# Patient Record
Sex: Female | Born: 1955 | Race: White | Hispanic: No | Marital: Married | Smoking: Current every day smoker
Health system: Southern US, Community
[De-identification: ages and names within clinical notes are randomized; demographics above are authoritative.]

## PROBLEM LIST (undated history)

## (undated) DIAGNOSIS — I2510 Atherosclerotic heart disease of native coronary artery without angina pectoris: Secondary | ICD-10-CM

## (undated) DIAGNOSIS — R569 Unspecified convulsions: Secondary | ICD-10-CM

## (undated) DIAGNOSIS — F329 Major depressive disorder, single episode, unspecified: Secondary | ICD-10-CM

## (undated) DIAGNOSIS — G2581 Restless legs syndrome: Secondary | ICD-10-CM

## (undated) DIAGNOSIS — J45909 Unspecified asthma, uncomplicated: Secondary | ICD-10-CM

## (undated) DIAGNOSIS — Z860100 Personal history of colon polyps, unspecified: Secondary | ICD-10-CM

## (undated) DIAGNOSIS — J189 Pneumonia, unspecified organism: Secondary | ICD-10-CM

## (undated) DIAGNOSIS — E119 Type 2 diabetes mellitus without complications: Secondary | ICD-10-CM

## (undated) DIAGNOSIS — G4733 Obstructive sleep apnea (adult) (pediatric): Secondary | ICD-10-CM

## (undated) DIAGNOSIS — I209 Angina pectoris, unspecified: Secondary | ICD-10-CM

## (undated) DIAGNOSIS — F32A Depression, unspecified: Secondary | ICD-10-CM

## (undated) DIAGNOSIS — E7800 Pure hypercholesterolemia, unspecified: Secondary | ICD-10-CM

## (undated) DIAGNOSIS — F909 Attention-deficit hyperactivity disorder, unspecified type: Secondary | ICD-10-CM

## (undated) DIAGNOSIS — F419 Anxiety disorder, unspecified: Secondary | ICD-10-CM

## (undated) DIAGNOSIS — K589 Irritable bowel syndrome without diarrhea: Secondary | ICD-10-CM

## (undated) DIAGNOSIS — I7090 Unspecified atherosclerosis: Secondary | ICD-10-CM

## (undated) DIAGNOSIS — K219 Gastro-esophageal reflux disease without esophagitis: Secondary | ICD-10-CM

## (undated) DIAGNOSIS — K8020 Calculus of gallbladder without cholecystitis without obstruction: Secondary | ICD-10-CM

## (undated) DIAGNOSIS — I10 Essential (primary) hypertension: Secondary | ICD-10-CM

## (undated) DIAGNOSIS — M129 Arthropathy, unspecified: Secondary | ICD-10-CM

## (undated) HISTORY — DX: Restless legs syndrome: G25.81

## (undated) HISTORY — DX: Anxiety disorder, unspecified: F41.9

## (undated) HISTORY — DX: Obstructive sleep apnea (adult) (pediatric): G47.33

## (undated) HISTORY — PX: ABDOMINAL HYSTERECTOMY: SHX81

## (undated) HISTORY — DX: Pure hypercholesterolemia, unspecified: E78.00

## (undated) HISTORY — DX: Unspecified asthma, uncomplicated: J45.909

## (undated) HISTORY — DX: Calculus of gallbladder without cholecystitis without obstruction: K80.20

## (undated) HISTORY — PX: CARPAL TUNNEL RELEASE: SHX101

## (undated) HISTORY — DX: Essential (primary) hypertension: I10

## (undated) HISTORY — DX: Pneumonia, unspecified organism: J18.9

## (undated) HISTORY — DX: Depression, unspecified: F32.A

## (undated) HISTORY — DX: Personal history of colonic polyps: Z86.010

## (undated) HISTORY — DX: Type 2 diabetes mellitus without complications: E11.9

## (undated) HISTORY — DX: Angina pectoris, unspecified: I20.9

## (undated) HISTORY — DX: Unspecified atherosclerosis: I70.90

## (undated) HISTORY — DX: Irritable bowel syndrome, unspecified: K58.9

## (undated) HISTORY — PX: TOTAL ABDOMINAL HYSTERECTOMY W/ BILATERAL SALPINGOOPHORECTOMY: SHX83

## (undated) HISTORY — DX: Arthropathy, unspecified: M12.9

## (undated) HISTORY — PX: TRIGGER FINGER RELEASE: SHX641

## (undated) HISTORY — DX: Attention-deficit hyperactivity disorder, unspecified type: F90.9

## (undated) HISTORY — DX: Personal history of colon polyps, unspecified: Z86.0100

## (undated) HISTORY — DX: Atherosclerotic heart disease of native coronary artery without angina pectoris: I25.10

## (undated) HISTORY — DX: Unspecified convulsions: R56.9

## (undated) HISTORY — DX: Gastro-esophageal reflux disease without esophagitis: K21.9

---
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
## 1978-06-13 HISTORY — PX: VESICOVAGINAL FISTULA CLOSURE W/ TAH: SUR271

## 1978-06-13 HISTORY — PX: APPENDECTOMY: SHX54

## 1995-06-14 HISTORY — PX: CHOLECYSTECTOMY: SHX55

## 1999-06-14 HISTORY — PX: CERVICAL FUSION: SHX112

## 2009-02-12 ENCOUNTER — Encounter: Admission: RE | Admit: 2009-02-12 | Discharge: 2009-02-12 | Payer: Self-pay | Admitting: Neurosurgery

## 2009-02-20 ENCOUNTER — Ambulatory Visit (HOSPITAL_COMMUNITY): Admission: RE | Admit: 2009-02-20 | Discharge: 2009-02-20 | Payer: Self-pay | Admitting: Neurosurgery

## 2009-04-03 ENCOUNTER — Ambulatory Visit (HOSPITAL_COMMUNITY): Admission: RE | Admit: 2009-04-03 | Discharge: 2009-04-03 | Payer: Self-pay | Admitting: Neurosurgery

## 2010-09-16 LAB — BASIC METABOLIC PANEL
CO2: 27 mEq/L (ref 19–32)
Calcium: 9.7 mg/dL (ref 8.4–10.5)
Creatinine, Ser: 0.89 mg/dL (ref 0.4–1.2)
GFR calc Af Amer: >60 mL/min (ref >60)
GFR calc non Af Amer: >60 mL/min (ref >60)
Glucose, Bld: 101 mg/dL — ABNORMAL HIGH (ref 70–99)

## 2010-09-16 LAB — CBC
MCHC: 34.8 g/dL (ref 30.0–36.0)
Platelets: 218 10*3/uL (ref 150–400)
RDW: 13.4 % (ref 11.5–15.5)

## 2010-09-17 LAB — CBC
Hemoglobin: 15 g/dL (ref 12.0–15.0)
RBC: 4.79 MIL/uL (ref 3.87–5.11)
RDW: 13.7 % (ref 11.5–15.5)
WBC: 10.6 10*3/uL — ABNORMAL HIGH (ref 4.0–10.5)

## 2010-09-17 LAB — BASIC METABOLIC PANEL
Calcium: 9.8 mg/dL (ref 8.4–10.5)
GFR calc Af Amer: >60 mL/min (ref >60)
GFR calc non Af Amer: >60 mL/min (ref >60)
Sodium: 137 mEq/L (ref 135–145)

## 2011-06-14 IMAGING — CR DG CHEST 2V
2 series · 2 of 2 positions shown · non-contrast
Comparison: None

CLINICAL DATA: Preadmission

CHEST - 2 VIEW

[view not recorded (1 of 2)]
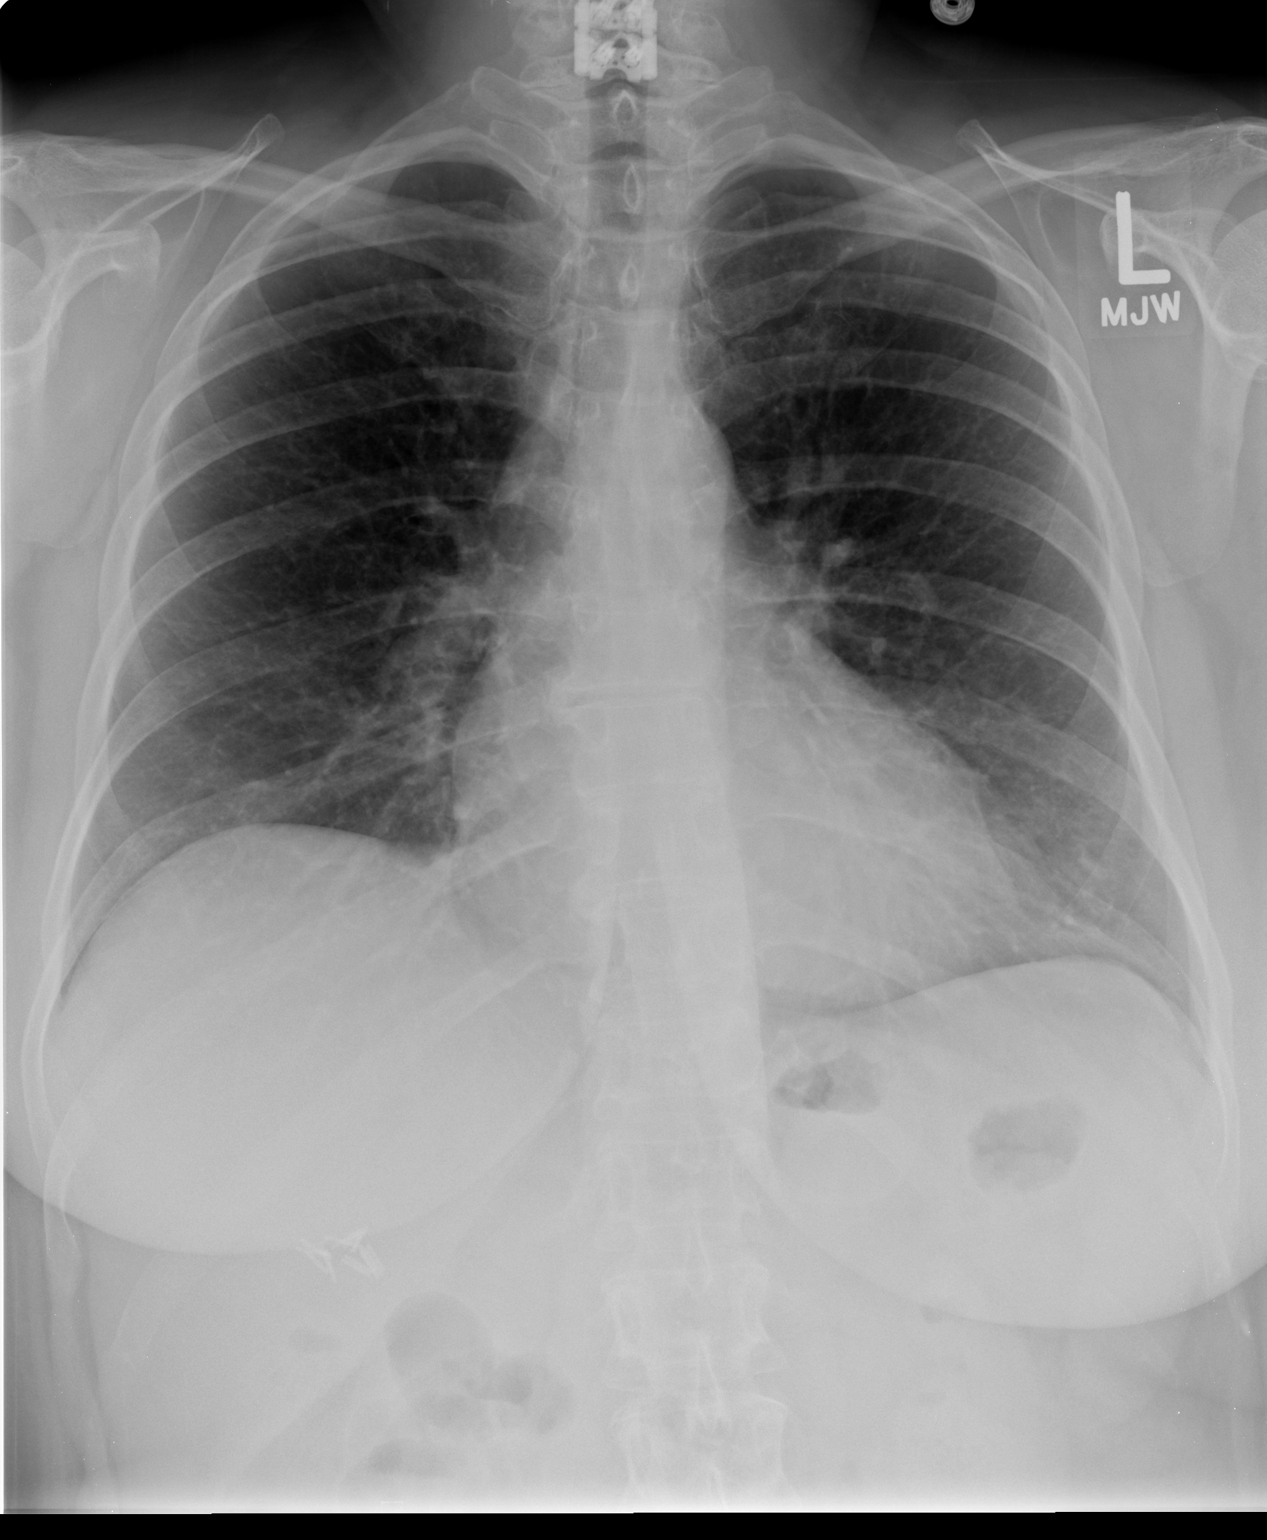

[view not recorded (2 of 2)]
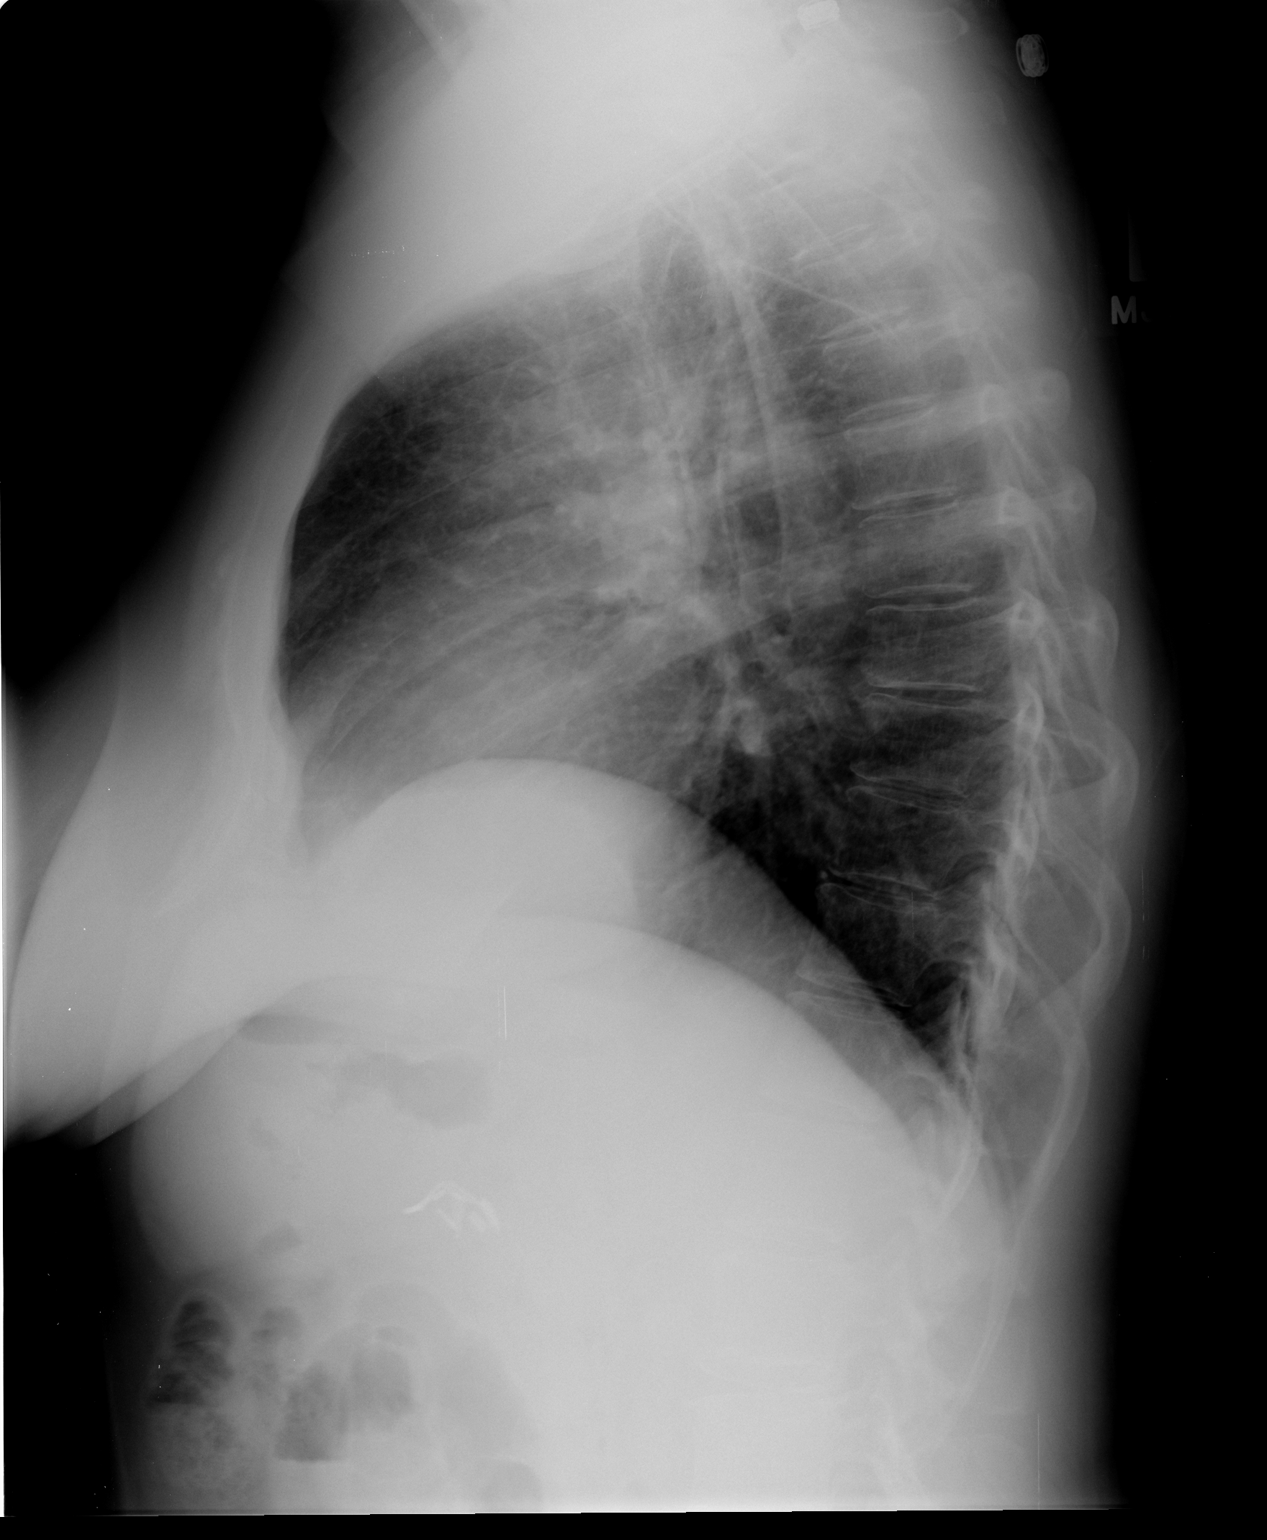

[2 of 2 positions shown; findings below may reference images not displayed]

FINDINGS: There is elevation of the right hemidiaphragm.
Cardiomediastinal silhouette is unremarkable.  No acute infiltrate
or edema.  Prior metallic fusion noted lower cervical spine.  Post
cholecystectomy surgical clips are noted.
IMPRESSION: No active disease.

## 2012-12-21 HISTORY — PX: ESOPHAGOGASTRODUODENOSCOPY: SHX1529

## 2014-01-17 HISTORY — PX: COLONOSCOPY: SHX5424

## 2014-04-09 ENCOUNTER — Ambulatory Visit (INDEPENDENT_AMBULATORY_CARE_PROVIDER_SITE_OTHER): Payer: Medicaid Other | Admitting: Internal Medicine

## 2014-04-09 ENCOUNTER — Encounter: Payer: Self-pay | Admitting: Internal Medicine

## 2014-04-09 VITALS — BP 102/74 | HR 97 | Ht 67.75 in | Wt 249.0 lb

## 2014-04-09 DIAGNOSIS — Z720 Tobacco use: Secondary | ICD-10-CM | POA: Diagnosis not present

## 2014-04-09 DIAGNOSIS — F17210 Nicotine dependence, cigarettes, uncomplicated: Secondary | ICD-10-CM | POA: Insufficient documentation

## 2014-04-09 DIAGNOSIS — R0600 Dyspnea, unspecified: Secondary | ICD-10-CM | POA: Insufficient documentation

## 2014-04-09 DIAGNOSIS — R058 Other specified cough: Secondary | ICD-10-CM | POA: Insufficient documentation

## 2014-04-09 DIAGNOSIS — R05 Cough: Secondary | ICD-10-CM

## 2014-04-09 DIAGNOSIS — J3489 Other specified disorders of nose and nasal sinuses: Secondary | ICD-10-CM

## 2014-04-09 HISTORY — DX: Nicotine dependence, cigarettes, uncomplicated: F17.210

## 2014-04-09 HISTORY — DX: Other specified disorders of nose and nasal sinuses: J34.89

## 2014-04-09 HISTORY — DX: Dyspnea, unspecified: R06.00

## 2014-04-09 HISTORY — DX: Other specified cough: R05.8

## 2014-04-09 MED ORDER — VALSARTAN 160 MG PO TABS: 160.0000 mg | ORAL_TABLET | Freq: Every day | ORAL | Status: DC

## 2014-04-09 NOTE — Assessment & Plan Note (Signed)
The most common causes of chronic cough in immunocompetent adults include the following: upper airway cough syndrome (UACS), previously referred to as postnasal drip syndrome (PNDS), which is caused by variety of rhinosinus conditions; (2) asthma; (3) GERD; (4) chronic bronchitis from cigarette smoking or other inhaled environmental irritants; (5) nonasthmatic eosinophilic bronchitis; and (6) bronchiectasis.   These conditions, singly or in combination, have accounted for up to 94% of the causes of chronic cough in prospective studies.   Other conditions have constituted no >6% of the causes in prospective studies These have included bronchogenic carcinoma, chronic interstitial pneumonia, sarcoidosis, left ventricular failure, ACEI-induced cough, and aspiration from a condition associated with pharyngeal dysfunction.    Chronic cough is often simultaneously caused by more than one condition. A single cause has been found from 38 to 82% of the time, multiple causes from 18 to 62%. Multiply caused cough has been the result of three diseases up to 42% of the time.       Based on hx and exam, this is most likely:  Classic Upper airway cough syndrome, so named because it's frequently impossible to sort out how much is  CR/sinusitis with freq throat clearing (which can be related to primary GERD)   vs  causing  secondary (" extra esophageal")  GERD from wide swings in gastric pressure that occur with throat clearing, often  promoting self use of mint and menthol lozenges that reduce the lower esophageal sphincter tone and exacerbate the problem further in a cyclical fashion.   These are the same pts (now being labeled as having "irritable larynx syndrome" by some cough centers) who not infrequently have a history of having failed to tolerate ace inhibitors (which is likely the case here) ,  dry powder inhalers(which is likely the case here) or biphosphonates or report having atypical reflux symptoms that don't  respond to standard doses of PPI (which is likely the case here) , and are easily confused as having aecopd or asthma flares by even experienced allergists/ pulmonologists.   The first step is to maximize acid suppression and eliminate cyclical coughing then regroup if the cough persists.  See instructions for specific recommendations which were reviewed directly with the patient who was given a copy with highlighter outlining the key components.

## 2014-04-09 NOTE — Assessment & Plan Note (Signed)
Symptoms are markedly disproportionate to objective findings and not clear this is a lung problem but pt does appear to have difficult airway management issues.   DDX of  difficult airways management all start with A and  include Adherence, Ace Inhibitors, Acid Reflux, Active Sinus Disease, Alpha 1 Antitripsin deficiency, Anxiety masquerading as Airways dz,  ABPA,  allergy(esp in young), Aspiration (esp in elderly), Adverse effects of DPI,  Active smokers, plus two Bs  = Bronchiectasis and Beta blocker use..and one C= CHF  ACEi at top of list > needs trial off  Active smoking also concern > see sep a/p  ? Acid (or non-acid) GERD > always difficult to exclude as up to 75% of pts in some series report no assoc GI/ Heartburn symptoms and ACEi and acid reflux are synergistic for destabilizing the upper airway in my experience > rec max (24h)  acid suppression and diet restrictions/ reviewed and instructions given in writing.

## 2014-04-09 NOTE — Patient Instructions (Addendum)
Stop lisinopril   Until better just use your duoneb for breathing problem, not your advair (ok to restart when your cough is gone)  Start diovan (valsartan) 160 mg one daily in its place   dexilant Take 30- 60 min before your first and last meals of the day   GERD (REFLUX)  is an extremely common cause of respiratory symptoms, many times with no significant heartburn at all.    It can be treated with medication, but also with lifestyle changes including avoidance of late meals, excessive alcohol, smoking cessation, and avoid fatty foods, chocolate, peppermint, colas, red wine, and acidic juices such as orange juice.  NO MINT OR MENTHOL PRODUCTS SO NO COUGH DROPS  USE SUGARLESS CANDY INSTEAD (jolley ranchers or Stover's)  NO OIL BASED VITAMINS - use powdered substitutes.    If not 100% back to your normal self, return in 4 weeks with all your medications in hand

## 2014-04-09 NOTE — Assessment & Plan Note (Signed)

## 2014-04-09 NOTE — Progress Notes (Signed)
Subjective:    Patient ID: Kara Olson, female    DOB: [birth year]  MRN: [id number]  HPI   89 yowf active smoker with h/o able to play on playground as child but never able to tolerated any kind of  aerobics but did all activities including push mower until summer 2013 at wt 89  dx asthma and sleep apnea by Dr Alcide Clever intially did well on advair and proair then gradually worse cough and sob since late summer of 2015 so referred by Dr Carolanne Grumbling to pulmonary clinic 04/09/2014    04/09/2014 1st [facility] office visit/ Melvyn Novas / on ACEi  Chief Complaint  Patient presents with  . Pulmonary Consult    Referred by Dr. Carolanne Grumbling. Pt c/o SOB for the past year, progressively getting worse. She states that she is SOB all the time- with or without any exertion.  She also c/o prod cough with "air bubbles"- sputum is clear.    since breathing got worse summer of 2015 more cough / congestion but no excess or purulent mucus and even duoneb not helping sense of sob/ throat congestion/ cough to point of choking at rest worst at hs and no longer able to use cpap due to cough. Also severe sense of HB despite dexilant, assoc with sense of "swollen all over"  = abd boating  but no objective swelling in feet/ankles advair makes it worse  No obvious other patterns in day to day or daytime variabilty or assoc chronic cough or cp or chest tightness, subjective wheeze overt sinus or hb symptoms. No unusual exp hx or h/o childhood pna/ asthma or knowledge of premature birth.  Sleeping ok without nocturnal  or early am exacerbation  of respiratory  c/o's or need for noct saba. Also denies any obvious fluctuation of symptoms with weather or environmental changes or other aggravating or alleviating factors except as outlined above   Current Medications, Allergies, Complete Past Medical History, Past Surgical History, Family History, and Social History were reviewed in [employer] record.          Review of Systems  Constitutional: Negative for fever, chills and unexpected weight change.  HENT: Negative for congestion, dental problem, ear pain, nosebleeds, postnasal drip, rhinorrhea, sinus pressure, sneezing, sore throat, trouble swallowing and voice change.   Eyes: Negative for visual disturbance.  Respiratory: Positive for cough and shortness of breath. Negative for choking.   Cardiovascular: Positive for leg swelling. Negative for chest pain.  Gastrointestinal: Negative for vomiting, abdominal pain and diarrhea.  Genitourinary: Negative for difficulty urinating.       Acid heartburn Indigestion  Musculoskeletal: Positive for arthralgias.  Skin: Negative for rash.  Neurological: Negative for tremors, syncope and headaches.  Hematological: Does not bruise/bleed easily.       Objective:   Physical Exam  Obese hoarse wf nad  Wt Readings from Last 3 Encounters:  04/09/14 249 lb (112.946 kg)     HEENT: nl dentition, turbinates, and orophanx. Nl external ear canals without cough reflex   NECK :  without JVD/Nodes/TM/ nl carotid upstrokes bilaterally   LUNGS: no acc muscle use, clear to A and P bilaterally without cough on insp or exp maneuvers   CV:  RRR  no s3 or murmur or increase in P2, no edema   ABD:  Obese/ soft and nontender with nl excursion in the supine position. No bruits or organomegaly, bowel sounds nl  MS:  warm without deformities, calf tenderness, cyanosis or clubbing  SKIN: warm and dry without lesions    NEURO:  alert, approp, no deficits          Assessment & Plan:

## 2014-05-07 ENCOUNTER — Ambulatory Visit: Payer: Medicaid Other | Admitting: Internal Medicine

## 2014-05-12 ENCOUNTER — Encounter: Payer: Self-pay | Admitting: Internal Medicine

## 2016-01-07 DIAGNOSIS — I2510 Atherosclerotic heart disease of native coronary artery without angina pectoris: Secondary | ICD-10-CM | POA: Insufficient documentation

## 2016-01-07 DIAGNOSIS — I10 Essential (primary) hypertension: Secondary | ICD-10-CM

## 2016-01-07 DIAGNOSIS — E785 Hyperlipidemia, unspecified: Secondary | ICD-10-CM

## 2016-01-07 DIAGNOSIS — G4730 Sleep apnea, unspecified: Secondary | ICD-10-CM | POA: Insufficient documentation

## 2016-01-07 HISTORY — DX: Essential (primary) hypertension: I10

## 2016-01-07 HISTORY — DX: Atherosclerotic heart disease of native coronary artery without angina pectoris: I25.10

## 2016-01-07 HISTORY — DX: Sleep apnea, unspecified: G47.30

## 2016-01-07 HISTORY — DX: Hyperlipidemia, unspecified: E78.5

## 2017-03-10 ENCOUNTER — Telehealth: Payer: Self-pay | Admitting: *Deleted

## 2017-03-10 NOTE — Telephone Encounter (Signed)
Received request for Medical records from [facility], forwarded to [place] for email/scan/SLS 09/28

## 2018-07-25 ENCOUNTER — Encounter: Payer: Self-pay | Admitting: Sports Medicine

## 2018-07-25 ENCOUNTER — Ambulatory Visit: Payer: Medicaid Other | Admitting: Sports Medicine

## 2018-07-25 VITALS — BP 139/84 | HR 53 | Resp 16 | Ht 67.0 in | Wt 230.0 lb

## 2018-07-25 DIAGNOSIS — B351 Tinea unguium: Secondary | ICD-10-CM | POA: Diagnosis not present

## 2018-07-25 DIAGNOSIS — M79674 Pain in right toe(s): Secondary | ICD-10-CM

## 2018-07-25 DIAGNOSIS — E119 Type 2 diabetes mellitus without complications: Secondary | ICD-10-CM

## 2018-07-25 DIAGNOSIS — M79675 Pain in left toe(s): Secondary | ICD-10-CM

## 2018-07-25 NOTE — Progress Notes (Signed)
Subjective: Kara Olson is a 63 y.o. female patient with history of diabetes who presents to office today complaining of long,mildly painful nails with corners that ingrow  while ambulating in shoes; unable to trim.  Reports that she had nail procedure many years ago.  Patient states that the glucose reading this morning was 130mg /dl. Patient denies any new changes in medication or new problems. Patient denies any new cramping, numbness, burning or tingling in the legs.  Patient last visit to primary care Dr. Delena Bali was 1 month ago and last A1c 6.5  Current smoker.  Review of Systems  Musculoskeletal:       Nail pain  All other systems reviewed and are negative. '  Patient Active Problem List   Diagnosis Date Noted  . Essential hypertension 01/07/2016  . Hyperlipidemia 01/07/2016  . Mild CAD 01/07/2016  . Sleep apnea 01/07/2016  . Upper airway cough syndrome 04/09/2014  . Dyspnea 04/09/2014  . Cigarette smoker 04/09/2014   Current Outpatient Medications on File Prior to Visit  Medication Sig Dispense Refill  . ALPRAZolam (XANAX) 0.5 MG tablet Take 0.5 mg by mouth 3 (three) times daily as needed for anxiety.    Marland Kitchen atorvastatin (LIPITOR) 40 MG tablet Take 40 mg by mouth daily.    . Cholecalciferol (VITAMIN D-3) 5000 UNITS TABS Take 1 tablet by mouth daily.    . dapagliflozin propanediol (FARXIGA) 5 MG TABS tablet Take by mouth.    . Dexlansoprazole (DEXILANT) 30 MG capsule Take 30 mg by mouth 2 (two) times daily.    Marland Kitchen ezetimibe (ZETIA) 10 MG tablet Take 10 mg by mouth daily.    . furosemide (LASIX) 40 MG tablet Take 80 mg by mouth daily.    Marland Kitchen ipratropium-albuterol (DUONEB) 0.5-2.5 (3) MG/3ML SOLN Take 3 mLs by nebulization every 4 (four) hours as needed.    Marland Kitchen losartan (COZAAR) 100 MG tablet Take 100 mg by mouth daily.    Marland Kitchen lubiprostone (AMITIZA) 24 MCG capsule Take 24 mcg by mouth 2 (two) times daily with a meal.    . metFORMIN (GLUCOPHAGE) 500 MG tablet Take by mouth.    .  metoprolol tartrate (LOPRESSOR) 50 MG tablet TAKE 1 TABLET BY MOUTH TWICE A DAY    . nitroGLYCERIN (NITROSTAT) 0.4 MG SL tablet Place 0.4 mg under the tongue every 5 (five) minutes as needed for chest pain.    . Vitamin D, Ergocalciferol, (DRISDOL) 1.25 MG (50000 UT) CAPS capsule Take by mouth.     No current facility-administered medications on file prior to visit.    Allergies  Allergen Reactions  . Sulfamethoxazole Hives  . Tape   . Latex Rash    No results found for this or any previous visit (from the past 2160 hour(s)).  Objective: General: Patient is awake, alert, and oriented x 3 and in no acute distress.  Integument: Skin is warm, dry and supple bilateral. Nails are tender, long, thickened and  dystrophic with subungual debris, consistent with onychomycosis, 1-5 bilateral.  Mild incurvation and thickness bilateral hallux and right second toe medial side that is not acute.  No signs of infection. No open lesions or preulcerative lesions present bilateral. Remaining integument unremarkable.  Vasculature:  Dorsalis Pedis pulse 1/4 bilateral. Posterior Tibial pulse  1/4 bilateral.  Capillary fill time <3 sec 1-5 bilateral. Positive hair growth to the level of the digits. Temperature gradient within normal limits. No varicosities present bilateral. No edema present bilateral.   Neurology: The patient has intact sensation  measured with a 5.07/10g Semmes Weinstein Monofilament at all pedal sites bilateral . Vibratory sensation diminished bilateral with tuning fork. No Babinski sign present bilateral.   Musculoskeletal: No symptomatic pedal deformities noted bilateral. Muscular strength 5/5 in all lower extremity muscular groups bilateral without pain on range of motion . No tenderness with calf compression bilateral.  Assessment and Plan: Problem List Items Addressed This Visit    None    Visit Diagnoses    Pain due to onychomycosis of toenails of both feet    -  Primary    Diabetes mellitus without complication (HCC)       Relevant Medications   dapagliflozin propanediol (FARXIGA) 5 MG TABS tablet   metFORMIN (GLUCOPHAGE) 500 MG tablet   losartan (COZAAR) 100 MG tablet      -Examined patient. -Discussed and educated patient on diabetic foot care, especially with  regards to the vascular, neurological and musculoskeletal systems.  -Stressed the importance of good glycemic control and the detriment of not  controlling glucose levels in relation to the foot. -Mechanically debrided all nails 1-5 bilateral using sterile nail nipper and filed with dremel without incident  -Patient to return  in 3 months for at risk foot care -Patient advised to call the office if any problems or questions arise in the meantime.  Landis Martins, DPM

## 2018-07-25 NOTE — Patient Instructions (Signed)
Diabetes Mellitus and Foot Care  Foot care is an important part of your health, especially when you have diabetes. Diabetes may cause you to have problems because of poor blood flow (circulation) to your feet and legs, which can cause your skin to:   Become thinner and drier.   Break more easily.   Heal more slowly.   Peel and crack.  You may also have nerve damage (neuropathy) in your legs and feet, causing decreased feeling in them. This means that you may not notice minor injuries to your feet that could lead to more serious problems. Noticing and addressing any potential problems early is the best way to prevent future foot problems.  How to care for your feet  Foot hygiene   Wash your feet daily with warm water and mild soap. Do not use hot water. Then, pat your feet and the areas between your toes until they are completely dry. Do not soak your feet as this can dry your skin.   Trim your toenails straight across. Do not dig under them or around the cuticle. File the edges of your nails with an emery board or nail file.   Apply a moisturizing lotion or petroleum jelly to the skin on your feet and to dry, brittle toenails. Use lotion that does not contain alcohol and is unscented. Do not apply lotion between your toes.  Shoes and socks   Wear clean socks or stockings every day. Make sure they are not too tight. Do not wear knee-high stockings since they may decrease blood flow to your legs.   Wear shoes that fit properly and have enough cushioning. Always look in your shoes before you put them on to be sure there are no objects inside.   To break in new shoes, wear them for just a few hours a day. This prevents injuries on your feet.  Wounds, scrapes, corns, and calluses   Check your feet daily for blisters, cuts, bruises, sores, and redness. If you cannot see the bottom of your feet, use a mirror or ask someone for help.   Do not cut corns or calluses or try to remove them with medicine.   If you  find a minor scrape, cut, or break in the skin on your feet, keep it and the skin around it clean and dry. You may clean these areas with mild soap and water. Do not clean the area with peroxide, alcohol, or iodine.   If you have a wound, scrape, corn, or callus on your foot, look at it several times a day to make sure it is healing and not infected. Check for:  ? Redness, swelling, or pain.  ? Fluid or blood.  ? Warmth.  ? Pus or a bad smell.  General instructions   Do not cross your legs. This may decrease blood flow to your feet.   Do not use heating pads or hot water bottles on your feet. They may burn your skin. If you have lost feeling in your feet or legs, you may not know this is happening until it is too late.   Protect your feet from hot and cold by wearing shoes, such as at the beach or on hot pavement.   Schedule a complete foot exam at least once a year (annually) or more often if you have foot problems. If you have foot problems, report any cuts, sores, or bruises to your health care provider immediately.  Contact a health care provider if:     You have a medical condition that increases your risk of infection and you have any cuts, sores, or bruises on your feet.   You have an injury that is not healing.   You have redness on your legs or feet.   You feel burning or tingling in your legs or feet.   You have pain or cramps in your legs and feet.   Your legs or feet are numb.   Your feet always feel cold.   You have pain around a toenail.  Get help right away if:   You have a wound, scrape, corn, or callus on your foot and:  ? You have pain, swelling, or redness that gets worse.  ? You have fluid or blood coming from the wound, scrape, corn, or callus.  ? Your wound, scrape, corn, or callus feels warm to the touch.  ? You have pus or a bad smell coming from the wound, scrape, corn, or callus.  ? You have a fever.  ? You have a red line going up your leg.  Summary   Check your feet every day  for cuts, sores, red spots, swelling, and blisters.   Moisturize feet and legs daily.   Wear shoes that fit properly and have enough cushioning.   If you have foot problems, report any cuts, sores, or bruises to your health care provider immediately.   Schedule a complete foot exam at least once a year (annually) or more often if you have foot problems.  This information is not intended to replace advice given to you by your health care provider. Make sure you discuss any questions you have with your health care provider.  Document Released: 05/27/2000 Document Revised: 07/12/2017 Document Reviewed: 07/01/2016  Elsevier Interactive Patient Education  2019 [employer]

## 2018-10-31 ENCOUNTER — Ambulatory Visit: Payer: Medicaid Other | Admitting: Sports Medicine

## 2018-11-08 ENCOUNTER — Ambulatory Visit: Payer: Medicaid Other | Admitting: Sports Medicine

## 2018-11-09 ENCOUNTER — Other Ambulatory Visit: Payer: Self-pay

## 2018-11-09 ENCOUNTER — Encounter: Payer: Self-pay | Admitting: Sports Medicine

## 2018-11-09 ENCOUNTER — Ambulatory Visit: Payer: Medicaid Other | Admitting: Sports Medicine

## 2018-11-09 VITALS — Temp 96.6°F | Resp 16

## 2018-11-09 DIAGNOSIS — L97511 Non-pressure chronic ulcer of other part of right foot limited to breakdown of skin: Secondary | ICD-10-CM

## 2018-11-09 DIAGNOSIS — E119 Type 2 diabetes mellitus without complications: Secondary | ICD-10-CM

## 2018-11-09 MED ORDER — MUPIROCIN 2 % EX OINT: TOPICAL_OINTMENT | CUTANEOUS | 0 refills | Status: DC

## 2018-11-09 NOTE — Progress Notes (Signed)
Subjective: Vonnie A Ruggerio is a 63 y.o. female patient seen in office for evaluation of ulceration of the right great toe reports 17 days ago she bumped the right toe on the sidewalk in the skin was scraped off she had it sutured back on by the emergency room and reports that it is still tender to touch especially with shoes or pressure roughly about 3 out of 10 with tingling sensation reports that she had the stitches removed 2 days ago with bloody drainage and a mild amount of redness reports that she has been using antibiotic cream and a Band-Aid over the previous open flap skin states that her primary care doctor advised her to come in follow-up with me for additional recommendations and care.  Patient reports that she has completed a course of antibiotics after having a fall injury and also breaking her arm.  Patient denies nausea vomiting fever chills or any other constitutional symptoms at this time.  Patient Active Problem List   Diagnosis Date Noted  . Essential hypertension 01/07/2016  . Hyperlipidemia 01/07/2016  . Mild CAD 01/07/2016  . Sleep apnea 01/07/2016  . Upper airway cough syndrome 04/09/2014  . Dyspnea 04/09/2014  . Cigarette smoker 04/09/2014   Current Outpatient Medications on File Prior to Visit  Medication Sig Dispense Refill  . acetaminophen-codeine (TYLENOL #3) 300-30 MG tablet TAKE 1 TABLET EVERY 4 6 HOURS AS NEEDED EACH MORNING    . ALPRAZolam (XANAX) 0.5 MG tablet Take 0.5 mg by mouth 3 (three) times daily as needed for anxiety.    Marland Kitchen atorvastatin (LIPITOR) 40 MG tablet Take 40 mg by mouth daily.    . Cholecalciferol (VITAMIN D-3) 5000 UNITS TABS Take 1 tablet by mouth daily.    . dapagliflozin propanediol (FARXIGA) 5 MG TABS tablet Take by mouth.    . Dexlansoprazole (DEXILANT) 30 MG capsule Take 30 mg by mouth 2 (two) times daily.    Marland Kitchen erythromycin ophthalmic ointment APPLY TO LIDS AT BEDTIME    . ezetimibe (ZETIA) 10 MG tablet Take 10 mg by mouth daily.    .  furosemide (LASIX) 40 MG tablet Take 80 mg by mouth daily.    Marland Kitchen ipratropium-albuterol (DUONEB) 0.5-2.5 (3) MG/3ML SOLN Take 3 mLs by nebulization every 4 (four) hours as needed.    Marland Kitchen losartan (COZAAR) 100 MG tablet Take 100 mg by mouth daily.    Marland Kitchen lubiprostone (AMITIZA) 24 MCG capsule Take 24 mcg by mouth 2 (two) times daily with a meal.    . metFORMIN (GLUCOPHAGE) 500 MG tablet Take by mouth.    . metoprolol tartrate (LOPRESSOR) 50 MG tablet TAKE 1 TABLET BY MOUTH TWICE A DAY    . nitroGLYCERIN (NITROSTAT) 0.4 MG SL tablet Place 0.4 mg under the tongue every 5 (five) minutes as needed for chest pain.    Marland Kitchen PARoxetine (PAXIL) 20 MG tablet Take 20 mg by mouth daily.    . Vitamin D, Ergocalciferol, (DRISDOL) 1.25 MG (50000 UT) CAPS capsule Take by mouth.     No current facility-administered medications on file prior to visit.    Allergies  Allergen Reactions  . Sulfamethoxazole Hives  . Tape   . Latex Rash    No results found for this or any previous visit (from the past 2160 hour(s)).  Objective: There were no vitals filed for this visit.  General: Patient is awake, alert, oriented x 3 and in no acute distress.  Dermatology: Skin is warm and dry bilateral with a partial thickness  ulceration present  Right hallux distal tuft.  Ulceration measures 0.3 cm x 0.4 cm x 0.1 cm. There is a  Keratotic border with a fibro-granular base and superficial skin layer/flap appears to be peeling up and not adhered.  The ulceration does not probe to bone. There is no malodor, no active drainage, no erythema, no edema. No acute signs of infection.   Vascular: Dorsalis Pedis pulse = 2/4 Bilateral,  Posterior Tibial pulse = 1/4 Bilateral,  Capillary Fill Time < 5 seconds  Neurologic: Protective sensation intact vibratory diminished bilateral.  Musculosketal: There is no pain to palpation only with excessive walking and standing at the right great toe.  Xrays at [facility] are negative  No  results for input(s): GRAMSTAIN, LABORGA in the last 8760 hours.  Assessment and Plan:  Problem List Items Addressed This Visit    None    Visit Diagnoses    Skin ulcer of toe of right foot, limited to breakdown of skin ([place])    -  Primary   Diabetes mellitus without complication ([place])           -Examined patient and discussed the progression of the wound and treatment alternatives. -Xrays reviewed from [facility] dedbrided ulceration at right great toe to healthy bleeding borders removing nonviable tissue using a sterile chisel blade. Wound measures post debridement. Wound was debrided to the level of the dermis with viable wound base exposed to promote healing. Hemostasis was achieved with manuel pressure. Patient tolerated procedure well without any discomfort or anesthesia necessary for this wound debridement.  -Applied Betadine and dry sterile dressing and instructed patient to continue with daily dressings at home consisting of Bactroban as prescribed and bandaid/dry sterile dressing. - Advised patient to go to the ER or return to office if the wound worsens or if constitutional symptoms are present. -Patient to return to office in 2 weeks for wound check and for routine diabetic nail care.  Landis Martins, DPM

## 2018-11-22 ENCOUNTER — Ambulatory Visit: Payer: Medicaid Other | Admitting: Sports Medicine

## 2018-11-22 ENCOUNTER — Other Ambulatory Visit: Payer: Self-pay

## 2018-11-22 ENCOUNTER — Encounter: Payer: Self-pay | Admitting: Sports Medicine

## 2018-11-22 VITALS — Temp 97.6°F | Resp 16

## 2018-11-22 DIAGNOSIS — B351 Tinea unguium: Secondary | ICD-10-CM | POA: Diagnosis not present

## 2018-11-22 DIAGNOSIS — M79675 Pain in left toe(s): Secondary | ICD-10-CM

## 2018-11-22 DIAGNOSIS — E119 Type 2 diabetes mellitus without complications: Secondary | ICD-10-CM

## 2018-11-22 DIAGNOSIS — M79674 Pain in right toe(s): Secondary | ICD-10-CM | POA: Diagnosis not present

## 2018-11-22 DIAGNOSIS — L97511 Non-pressure chronic ulcer of other part of right foot limited to breakdown of skin: Secondary | ICD-10-CM

## 2018-11-22 NOTE — Progress Notes (Signed)
Subjective: Kara Olson is a 63 y.o. female patient with history of diabetes who presents to office today complaining of long,mildly painful nails  while ambulating in shoes; unable to trim. Patient is also here for a toe check at the right great toe had a wound that happened after a stub injury.  Patient reports that the area is healing up fine with no active drainage redness warmth or pain to the right great toe.  Patient states that the glucose reading this morning was 120 mg/dl. Patient denies any new changes in medication or new problems. Patient denies any new cramping, numbness, burning or tingling in the legs.  Last A1c 6.1 last visit to primary care doctor was 3 months ago  Patient Active Problem List   Diagnosis Date Noted  . Essential hypertension 01/07/2016  . Hyperlipidemia 01/07/2016  . Mild CAD 01/07/2016  . Sleep apnea 01/07/2016  . Upper airway cough syndrome 04/09/2014  . Dyspnea 04/09/2014  . Cigarette smoker 04/09/2014   Current Outpatient Medications on File Prior to Visit  Medication Sig Dispense Refill  . acetaminophen-codeine (TYLENOL #3) 300-30 MG tablet TAKE 1 TABLET EVERY 4 6 HOURS AS NEEDED EACH MORNING    . ALPRAZolam (XANAX) 0.5 MG tablet Take 0.5 mg by mouth 3 (three) times daily as needed for anxiety.    Marland Kitchen atorvastatin (LIPITOR) 40 MG tablet Take 40 mg by mouth daily.    . Cholecalciferol (VITAMIN D-3) 5000 UNITS TABS Take 1 tablet by mouth daily.    . dapagliflozin propanediol (FARXIGA) 5 MG TABS tablet Take by mouth.    . Dexlansoprazole (DEXILANT) 30 MG capsule Take 30 mg by mouth 2 (two) times daily.    Marland Kitchen erythromycin ophthalmic ointment APPLY TO LIDS AT BEDTIME    . ezetimibe (ZETIA) 10 MG tablet Take 10 mg by mouth daily.    . furosemide (LASIX) 40 MG tablet Take 80 mg by mouth daily.    Marland Kitchen ipratropium-albuterol (DUONEB) 0.5-2.5 (3) MG/3ML SOLN Take 3 mLs by nebulization every 4 (four) hours as needed.    Marland Kitchen losartan (COZAAR) 100 MG tablet Take 100  mg by mouth daily.    Marland Kitchen lubiprostone (AMITIZA) 24 MCG capsule Take 24 mcg by mouth 2 (two) times daily with a meal.    . metFORMIN (GLUCOPHAGE) 500 MG tablet Take by mouth.    . metoprolol tartrate (LOPRESSOR) 50 MG tablet TAKE 1 TABLET BY MOUTH TWICE A DAY    . mupirocin ointment (BACTROBAN) 2 % Apply once daily to toe 22 g 0  . nitroGLYCERIN (NITROSTAT) 0.4 MG SL tablet Place 0.4 mg under the tongue every 5 (five) minutes as needed for chest pain.    Marland Kitchen PARoxetine (PAXIL) 20 MG tablet Take 20 mg by mouth daily.    . Vitamin D, Ergocalciferol, (DRISDOL) 1.25 MG (50000 UT) CAPS capsule Take by mouth.     No current facility-administered medications on file prior to visit.    Allergies  Allergen Reactions  . Sulfamethoxazole Hives  . Tape   . Latex Rash    No results found for this or any previous visit (from the past 2160 hour(s)).  Objective: General: Patient is awake, alert, and oriented x 3 and in no acute distress.  Integument: Skin is warm, dry and supple bilateral.  Right great toe ulceration is well-healed.  Nails are tender, long, thickened and  dystrophic with subungual debris, consistent with onychomycosis, 1-5 bilateral. No signs of infection. No open lesions or preulcerative lesions present  bilateral. Remaining integument unremarkable.  Vasculature:  Dorsalis Pedis pulse 2/4 bilateral. Posterior Tibial pulse 1/4 bilateral.  Capillary fill time <3 sec 1-5 bilateral. Positive hair growth to the level of the digits. Temperature gradient within normal limits. No varicosities present bilateral. No edema present bilateral.   Neurology: The patient has intact sensation measured with a 5.07/10g Semmes Weinstein Monofilament at all pedal sites bilateral . Vibratory sensation diminished bilateral with tuning fork. No Babinski sign present bilateral.   Musculoskeletal: No symptomatic pedal deformities noted bilateral. Muscular strength 5/5 in all lower extremity muscular groups  bilateral without pain on range of motion . No tenderness with calf compression bilateral.  Assessment and Plan: Problem List Items Addressed This Visit    None    Visit Diagnoses    Pain due to onychomycosis of toenails of both feet    -  Primary   Diabetes mellitus without complication (HCC)       Skin ulcer of toe of right foot, limited to breakdown of skin ([place])       Healed      -Examined patient. -Discussed and educated patient on diabetic foot care, especially with  regards to the vascular, neurological and musculoskeletal systems.  -Stressed the importance of good glycemic control and the detriment of not  controlling glucose levels in relation to the foot. -Mechanically debrided all nails 1-5 bilateral using sterile nail nipper and filed with dremel without incident  -Advised patient to continue with monitoring the right great toe it is healed well and to avoid things that may irritate or cause re-ulceration -Answered all patient questions -Patient to return  in 3 months for at risk foot care -Patient advised to call the office if any problems or questions arise in the meantime.  Landis Martins, DPM

## 2018-12-17 DIAGNOSIS — E785 Hyperlipidemia, unspecified: Secondary | ICD-10-CM

## 2018-12-17 DIAGNOSIS — G8929 Other chronic pain: Secondary | ICD-10-CM

## 2018-12-17 DIAGNOSIS — I2510 Atherosclerotic heart disease of native coronary artery without angina pectoris: Secondary | ICD-10-CM | POA: Diagnosis not present

## 2018-12-17 DIAGNOSIS — K219 Gastro-esophageal reflux disease without esophagitis: Secondary | ICD-10-CM

## 2018-12-17 DIAGNOSIS — E119 Type 2 diabetes mellitus without complications: Secondary | ICD-10-CM

## 2018-12-17 DIAGNOSIS — R0789 Other chest pain: Secondary | ICD-10-CM | POA: Diagnosis not present

## 2018-12-17 DIAGNOSIS — M545 Low back pain: Secondary | ICD-10-CM | POA: Diagnosis not present

## 2018-12-18 DIAGNOSIS — I2510 Atherosclerotic heart disease of native coronary artery without angina pectoris: Secondary | ICD-10-CM | POA: Diagnosis not present

## 2018-12-18 DIAGNOSIS — E785 Hyperlipidemia, unspecified: Secondary | ICD-10-CM | POA: Diagnosis not present

## 2018-12-18 DIAGNOSIS — R0789 Other chest pain: Secondary | ICD-10-CM | POA: Diagnosis not present

## 2018-12-18 DIAGNOSIS — M545 Low back pain: Secondary | ICD-10-CM | POA: Diagnosis not present

## 2018-12-18 DIAGNOSIS — R079 Chest pain, unspecified: Secondary | ICD-10-CM

## 2019-02-21 ENCOUNTER — Ambulatory Visit: Payer: Medicaid Other | Admitting: Sports Medicine

## 2019-03-07 ENCOUNTER — Other Ambulatory Visit: Payer: Self-pay

## 2019-03-07 ENCOUNTER — Encounter: Payer: Self-pay | Admitting: Sports Medicine

## 2019-03-07 ENCOUNTER — Ambulatory Visit (INDEPENDENT_AMBULATORY_CARE_PROVIDER_SITE_OTHER): Payer: Medicaid Other | Admitting: Sports Medicine

## 2019-03-07 DIAGNOSIS — M79674 Pain in right toe(s): Secondary | ICD-10-CM | POA: Diagnosis not present

## 2019-03-07 DIAGNOSIS — M79675 Pain in left toe(s): Secondary | ICD-10-CM | POA: Diagnosis not present

## 2019-03-07 DIAGNOSIS — B351 Tinea unguium: Secondary | ICD-10-CM | POA: Diagnosis not present

## 2019-03-07 DIAGNOSIS — E119 Type 2 diabetes mellitus without complications: Secondary | ICD-10-CM

## 2019-03-07 NOTE — Progress Notes (Signed)
Subjective: Kara Olson is a 63 y.o. female patient with history of diabetes who presents to office today complaining of long,mildly painful nails  while ambulating in shoes; unable to trim.  Patient reports that her right great toe is doing well with no recurrence of problems or issues.  Patient states that the glucose reading this morning was 160 mg/dl. Patient denies any new changes in medication or new problems.  Last A1c 6.2 last visit to primary care doctor was last month and will see again in December  Patient Active Problem List   Diagnosis Date Noted  . Essential hypertension 01/07/2016  . Hyperlipidemia 01/07/2016  . Mild CAD 01/07/2016  . Sleep apnea 01/07/2016  . Upper airway cough syndrome 04/09/2014  . Dyspnea 04/09/2014  . Cigarette smoker 04/09/2014   Current Outpatient Medications on File Prior to Visit  Medication Sig Dispense Refill  . acetaminophen-codeine (TYLENOL #3) 300-30 MG tablet TAKE 1 TABLET EVERY 4 6 HOURS AS NEEDED EACH MORNING    . ALPRAZolam (XANAX) 0.5 MG tablet Take 0.5 mg by mouth 3 (three) times daily as needed for anxiety.    Marland Kitchen atorvastatin (LIPITOR) 40 MG tablet Take 40 mg by mouth daily.    . Cholecalciferol (VITAMIN D-3) 5000 UNITS TABS Take 1 tablet by mouth daily.    . dapagliflozin propanediol (FARXIGA) 5 MG TABS tablet Take by mouth.    . Dexlansoprazole (DEXILANT) 30 MG capsule Take 30 mg by mouth 2 (two) times daily.    Marland Kitchen erythromycin ophthalmic ointment APPLY TO LIDS AT BEDTIME    . ezetimibe (ZETIA) 10 MG tablet Take 10 mg by mouth daily.    . furosemide (LASIX) 40 MG tablet Take 80 mg by mouth daily.    Marland Kitchen ipratropium-albuterol (DUONEB) 0.5-2.5 (3) MG/3ML SOLN Take 3 mLs by nebulization every 4 (four) hours as needed.    Marland Kitchen losartan (COZAAR) 100 MG tablet Take 100 mg by mouth daily.    Marland Kitchen lubiprostone (AMITIZA) 24 MCG capsule Take 24 mcg by mouth 2 (two) times daily with a meal.    . metFORMIN (GLUCOPHAGE) 500 MG tablet Take by mouth.     . metoprolol tartrate (LOPRESSOR) 50 MG tablet TAKE 1 TABLET BY MOUTH TWICE A DAY    . mupirocin ointment (BACTROBAN) 2 % Apply once daily to toe 22 g 0  . nitroGLYCERIN (NITROSTAT) 0.4 MG SL tablet Place 0.4 mg under the tongue every 5 (five) minutes as needed for chest pain.    Marland Kitchen PARoxetine (PAXIL) 20 MG tablet Take 20 mg by mouth daily.    . Vitamin D, Ergocalciferol, (DRISDOL) 1.25 MG (50000 UT) CAPS capsule Take by mouth.     No current facility-administered medications on file prior to visit.    Allergies  Allergen Reactions  . Sulfamethoxazole Hives  . Tape   . Latex Rash    No results found for this or any previous visit (from the past 2160 hour(s)).  Objective: General: Patient is awake, alert, and oriented x 3 and in no acute distress.  Integument: Skin is warm, dry and supple bilateral.  Right great toe ulceration is well-healed.  Nails are tender, long, thickened and  dystrophic with subungual debris, consistent with onychomycosis, 1-5 bilateral. No signs of infection. No open lesions or preulcerative lesions present bilateral. Remaining integument unremarkable.  Vasculature:  Dorsalis Pedis pulse 2/4 bilateral. Posterior Tibial pulse 1/4 bilateral.  Capillary fill time <3 sec 1-5 bilateral. Positive hair growth to the level of the digits.  Temperature gradient within normal limits. No varicosities present bilateral. No edema present bilateral.   Neurology: The patient has intact sensation measured with a 5.07/10g Semmes Weinstein Monofilament at all pedal sites bilateral . Vibratory sensation diminished bilateral with tuning fork. No Babinski sign present bilateral.   Musculoskeletal: No symptomatic pedal deformities noted bilateral. Muscular strength 5/5 in all lower extremity muscular groups bilateral without pain on range of motion . No tenderness with calf compression bilateral.  Assessment and Plan: Problem List Items Addressed This Visit    None    Visit  Diagnoses    Pain due to onychomycosis of toenails of both feet    -  Primary   Diabetes mellitus without complication ([place])          -Examined patient. -Re-Discussed and educated patient on diabetic foot care, especially with regards to the vascular, neurological and musculoskeletal systems.  -Mechanically debrided all nails 1-5 bilateral using sterile nail nipper and filed with dremel without incident  -Continue with good supportive shoes that do not rub or irritate the toes especially the well healed right first toe -Answered all patient questions -Patient to return  in 3 months for at risk foot care -Patient advised to call the office if any problems or questions arise in the meantime.  Landis Martins, DPM

## 2019-04-15 ENCOUNTER — Encounter: Payer: Self-pay | Admitting: Gastroenterology

## 2019-06-20 ENCOUNTER — Ambulatory Visit: Payer: Medicaid Other | Admitting: Sports Medicine

## 2019-06-20 ENCOUNTER — Other Ambulatory Visit: Payer: Self-pay

## 2019-06-20 ENCOUNTER — Encounter: Payer: Self-pay | Admitting: Sports Medicine

## 2019-06-20 DIAGNOSIS — B351 Tinea unguium: Secondary | ICD-10-CM | POA: Diagnosis not present

## 2019-06-20 DIAGNOSIS — E119 Type 2 diabetes mellitus without complications: Secondary | ICD-10-CM

## 2019-06-20 DIAGNOSIS — M79675 Pain in left toe(s): Secondary | ICD-10-CM

## 2019-06-20 DIAGNOSIS — M79674 Pain in right toe(s): Secondary | ICD-10-CM | POA: Diagnosis not present

## 2019-06-20 NOTE — Progress Notes (Signed)
Subjective: Kara Olson is a 64 y.o. female patient with history of diabetes who presents to office today complaining of long,mildly painful nails  while ambulating in shoes; unable to trim.  Patient reports that her nails have been digging in and bothering her. Patient states that the glucose reading this morning was 120 mg/dl, last A1c 6.1 and last visit PCP was 2 months ago. Patient denies any new changes in medication or new problems.   Patient Active Problem List   Diagnosis Date Noted  . Essential hypertension 01/07/2016  . Hyperlipidemia 01/07/2016  . Mild CAD 01/07/2016  . Sleep apnea 01/07/2016  . Upper airway cough syndrome 04/09/2014  . Dyspnea 04/09/2014  . Cigarette smoker 04/09/2014   Current Outpatient Medications on File Prior to Visit  Medication Sig Dispense Refill  . acetaminophen-codeine (TYLENOL #3) 300-30 MG tablet TAKE 1 TABLET EVERY 4 6 HOURS AS NEEDED EACH MORNING    . ALPRAZolam (XANAX) 0.5 MG tablet Take 0.5 mg by mouth 3 (three) times daily as needed for anxiety.    Marland Kitchen atorvastatin (LIPITOR) 40 MG tablet Take 40 mg by mouth daily.    . Cholecalciferol (VITAMIN D-3) 5000 UNITS TABS Take 1 tablet by mouth daily.    . dapagliflozin propanediol (FARXIGA) 5 MG TABS tablet Take by mouth.    . Dexlansoprazole (DEXILANT) 30 MG capsule Take 30 mg by mouth 2 (two) times daily.    Marland Kitchen erythromycin ophthalmic ointment APPLY TO LIDS AT BEDTIME    . ezetimibe (ZETIA) 10 MG tablet Take 10 mg by mouth daily.    . furosemide (LASIX) 40 MG tablet Take 80 mg by mouth daily.    Marland Kitchen ipratropium-albuterol (DUONEB) 0.5-2.5 (3) MG/3ML SOLN Take 3 mLs by nebulization every 4 (four) hours as needed.    Marland Kitchen losartan (COZAAR) 100 MG tablet Take 100 mg by mouth daily.    Marland Kitchen lubiprostone (AMITIZA) 24 MCG capsule Take 24 mcg by mouth 2 (two) times daily with a meal.    . metFORMIN (GLUCOPHAGE) 500 MG tablet Take by mouth.    . metoprolol tartrate (LOPRESSOR) 50 MG tablet TAKE 1 TABLET BY MOUTH  TWICE A DAY    . mupirocin ointment (BACTROBAN) 2 % Apply once daily to toe 22 g 0  . nitroGLYCERIN (NITROSTAT) 0.4 MG SL tablet Place 0.4 mg under the tongue every 5 (five) minutes as needed for chest pain.    Marland Kitchen PARoxetine (PAXIL) 20 MG tablet Take 20 mg by mouth daily.    . Vitamin D, Ergocalciferol, (DRISDOL) 1.25 MG (50000 UT) CAPS capsule Take by mouth.     No current facility-administered medications on file prior to visit.   Allergies  Allergen Reactions  . Sulfamethoxazole Hives  . Tape   . Latex Rash    No results found for this or any previous visit (from the past 2160 hour(s)).  Objective: General: Patient is awake, alert, and oriented x 3 and in no acute distress.  Integument: Skin is warm, dry and supple bilateral.  Nails are tender, long, thickened and  dystrophic with subungual debris, consistent with onychomycosis, 1-5 bilateral. There is mild incurvation bilateral hallux as well as right second toe at nail margins with no signs of infection. No open lesions or preulcerative lesions present bilateral. Remaining integument unremarkable.  Vasculature:  Dorsalis Pedis pulse 2/4 bilateral. Posterior Tibial pulse 1/4 bilateral.  Capillary fill time <3 sec 1-5 bilateral. Positive hair growth to the level of the digits. Temperature gradient within normal limits.  No varicosities present bilateral. No edema present bilateral.   Neurology: The patient has intact sensation measured with a 5.07/10g Semmes Weinstein Monofilament at all pedal sites bilateral . Vibratory sensation diminished bilateral with tuning fork. No Babinski sign present bilateral.   Musculoskeletal: No symptomatic pedal deformities noted bilateral. Muscular strength 5/5 in all lower extremity muscular groups bilateral without pain on range of motion . No tenderness with calf compression bilateral.  Assessment and Plan: Problem List Items Addressed This Visit    None    Visit Diagnoses    Pain due to  onychomycosis of toenails of both feet    -  Primary   Diabetes mellitus without complication ([place])          -Examined patient. -Re-Discussed and educated patient on diabetic foot care, especially with regards to the vascular, neurological and musculoskeletal systems.  -Mechanically debrided all nails 1-5 bilateral using sterile nail nipper and filed with dremel without incident and removed offending borders as tolerated -Patient to return as scheduled for bilateral nail avulsion procedure -Patient advised to call the office if any problems or questions arise in the meantime.  Landis Martins, DPM

## 2019-06-28 ENCOUNTER — Other Ambulatory Visit: Payer: Self-pay

## 2019-06-28 ENCOUNTER — Encounter: Payer: Self-pay | Admitting: Sports Medicine

## 2019-06-28 ENCOUNTER — Ambulatory Visit: Payer: Medicaid Other | Admitting: Sports Medicine

## 2019-06-28 DIAGNOSIS — M79675 Pain in left toe(s): Secondary | ICD-10-CM

## 2019-06-28 DIAGNOSIS — E119 Type 2 diabetes mellitus without complications: Secondary | ICD-10-CM

## 2019-06-28 DIAGNOSIS — M79674 Pain in right toe(s): Secondary | ICD-10-CM

## 2019-06-28 DIAGNOSIS — L600 Ingrowing nail: Secondary | ICD-10-CM

## 2019-06-28 MED ORDER — NEOMYCIN-POLYMYXIN-HC 3.5-10000-1 OT SOLN: OTIC | 0 refills | Status: DC

## 2019-06-28 MED ORDER — DOXYCYCLINE HYCLATE 100 MG PO TABS: 100.0000 mg | ORAL_TABLET | Freq: Two times a day (BID) | ORAL | 0 refills | Status: DC

## 2019-06-28 NOTE — Progress Notes (Signed)
Subjective: Kara Olson is a 64 y.o. female patient presents to office today complaining of a  Mild to moderately painful incurvated, tender and swollen R>L hallux and 2nd toe nail borders This has been present for years off and and on. Patient has treated this by trimming with the most recent trim done by me last week.  Patient denies fever/chills/nausea/vomitting/any other related constitutional symptoms at this time.  Patient has treated with Epson salts to help with any soreness on the toes reports since last week it has been a little better but still wants to move forward with having her ingrown toenails removed.  Patient is diabetic last blood sugar around 120 last A1c 6.1 and last PCP visit was 2 months ago.  Patient Active Problem List   Diagnosis Date Noted  . Essential hypertension 01/07/2016  . Hyperlipidemia 01/07/2016  . Mild CAD 01/07/2016  . Sleep apnea 01/07/2016  . Upper airway cough syndrome 04/09/2014  . Dyspnea 04/09/2014  . Cigarette smoker 04/09/2014    Current Outpatient Medications on File Prior to Visit  Medication Sig Dispense Refill  . acetaminophen-codeine (TYLENOL #3) 300-30 MG tablet TAKE 1 TABLET EVERY 4 6 HOURS AS NEEDED EACH MORNING    . ALPRAZolam (XANAX) 0.5 MG tablet Take 0.5 mg by mouth 3 (three) times daily as needed for anxiety.    Marland Kitchen atorvastatin (LIPITOR) 40 MG tablet Take 40 mg by mouth daily.    . Cholecalciferol (VITAMIN D-3) 5000 UNITS TABS Take 1 tablet by mouth daily.    . dapagliflozin propanediol (FARXIGA) 5 MG TABS tablet Take by mouth.    . Dexlansoprazole (DEXILANT) 30 MG capsule Take 30 mg by mouth 2 (two) times daily.    Marland Kitchen erythromycin ophthalmic ointment APPLY TO LIDS AT BEDTIME    . ezetimibe (ZETIA) 10 MG tablet Take 10 mg by mouth daily.    . furosemide (LASIX) 40 MG tablet Take 80 mg by mouth daily.    Marland Kitchen ipratropium-albuterol (DUONEB) 0.5-2.5 (3) MG/3ML SOLN Take 3 mLs by nebulization every 4 (four) hours as needed.    Marland Kitchen  losartan (COZAAR) 100 MG tablet Take 100 mg by mouth daily.    Marland Kitchen lubiprostone (AMITIZA) 24 MCG capsule Take 24 mcg by mouth 2 (two) times daily with a meal.    . metFORMIN (GLUCOPHAGE) 500 MG tablet Take by mouth.    . metoprolol tartrate (LOPRESSOR) 50 MG tablet TAKE 1 TABLET BY MOUTH TWICE A DAY    . mupirocin ointment (BACTROBAN) 2 % Apply once daily to toe 22 g 0  . nitroGLYCERIN (NITROSTAT) 0.4 MG SL tablet Place 0.4 mg under the tongue every 5 (five) minutes as needed for chest pain.    Marland Kitchen PARoxetine (PAXIL) 20 MG tablet Take 20 mg by mouth daily.    . Vitamin D, Ergocalciferol, (DRISDOL) 1.25 MG (50000 UT) CAPS capsule Take by mouth.     No current facility-administered medications on file prior to visit.    Allergies  Allergen Reactions  . Sulfamethoxazole Hives  . Tape   . Latex Rash    Objective:  There were no vitals filed for this visit.  General: Well developed, nourished, in no acute distress, alert and oriented x3   Dermatology: Skin is warm, dry and supple bilateral.  Right second toe and bilateral hallux nail appears to be moderately incurvated with hyperkeratosis formation at the distal aspects of the medial and lateral nail borders with severe thickening of nails supportive of mycosis. (+) Erythema. (-)  Edema. (-) serosanguous  drainage present. The remaining nails appear unremarkable at this time. There are no open sores, lesions or other signs of infection present.  Vascular: Dorsalis Pedis artery and Posterior Tibial artery pedal pulses are 1/4 bilateral with immedate capillary fill time. Pedal hair growth present. No lower extremity edema.   Neruologic: Grossly intact via light touch bilateral.  Musculoskeletal: Tenderness to palpation of the bilateral hallux and right second toe nail folds. Muscular strength within normal limits in all groups bilateral.   Assesement and Plan: Problem List Items Addressed This Visit    None    Visit Diagnoses    Ingrown  nail    -  Primary   Relevant Medications   doxycycline (VIBRA-TABS) 100 MG tablet   Toe pain, bilateral       Diabetes mellitus without complication ([place])          -Discussed treatment alternatives and plan of care; Explained permanent/temporary nail avulsion and post procedure course to patient. Patient elects for partial permanent nail avulsion bilateral hallux and second toe on right. - After a verbal and written consent, injected 3 ml of a 50:50 mixture of 2% plain  lidocaine and 0.5% plain marcaine in a normal hallux and digital block fashion. Next, a  betadine prep was performed. Anesthesia was tested and found to be appropriate.  The offending right second and bilateral hallux nail borders were then incised from the hyponychium to the epinychium. The offending nail border was removed and cleared from the field. The area was curretted for any remaining nail or spicules. Phenol application performed and the area was then flushed with alcohol and dressed with antibiotic cream and a dry sterile dressing. -Patient was instructed to leave the dressing intact for today and begin soaking  in a weak solution of betadine or Epsom salt and water tomorrow. Patient was instructed to  soak for 15-20 minutes each day and apply neosporin/corticosporin and a gauze or bandaid dressing each day. -Patient was instructed to monitor the toes for signs of infection and return to office if toe becomes red, hot or swollen. -Prescribe doxycycline for preventative measures -Advised ice, elevation, and tylenol or motrin if needed for pain.  -Patient is to return in 2 weeks for follow up care/nail check or sooner if problems arise.  Landis Martins, DPM

## 2019-06-28 NOTE — Patient Instructions (Signed)

## 2019-07-12 ENCOUNTER — Ambulatory Visit: Payer: Medicaid Other | Admitting: Sports Medicine

## 2019-07-17 ENCOUNTER — Ambulatory Visit (INDEPENDENT_AMBULATORY_CARE_PROVIDER_SITE_OTHER): Payer: Medicaid Other | Admitting: Sports Medicine

## 2019-07-17 ENCOUNTER — Encounter: Payer: Self-pay | Admitting: Sports Medicine

## 2019-07-17 ENCOUNTER — Other Ambulatory Visit: Payer: Self-pay

## 2019-07-17 DIAGNOSIS — L600 Ingrowing nail: Secondary | ICD-10-CM

## 2019-07-17 DIAGNOSIS — E119 Type 2 diabetes mellitus without complications: Secondary | ICD-10-CM

## 2019-07-17 DIAGNOSIS — M79675 Pain in left toe(s): Secondary | ICD-10-CM

## 2019-07-17 DIAGNOSIS — M79674 Pain in right toe(s): Secondary | ICD-10-CM

## 2019-07-17 DIAGNOSIS — Z9889 Other specified postprocedural states: Secondary | ICD-10-CM

## 2019-07-17 NOTE — Progress Notes (Signed)
Subjective: Kara Olson is a 64 y.o. diabetic female patient returns to office today for follow up evaluation after having Right/Left Hallux and right 2nd medial/lateral permanent nail avulsions performed on (06-28-19). Patient has been soaking using epsom salt and applying topical antibiotic covered with bandaid daily. Patient  Reports redness that is now better denies fever/chills/nausea/vomitting/any other related constitutional symptoms at this time.  FBS 149 A1c 6.9   Patient Active Problem List   Diagnosis Date Noted  . Essential hypertension 01/07/2016  . Hyperlipidemia 01/07/2016  . Mild CAD 01/07/2016  . Sleep apnea 01/07/2016  . Upper airway cough syndrome 04/09/2014  . Dyspnea 04/09/2014  . Cigarette smoker 04/09/2014    Current Outpatient Medications on File Prior to Visit  Medication Sig Dispense Refill  . acetaminophen-codeine (TYLENOL #3) 300-30 MG tablet TAKE 1 TABLET EVERY 4 6 HOURS AS NEEDED EACH MORNING    . ALPRAZolam (XANAX) 0.5 MG tablet Take 0.5 mg by mouth 3 (three) times daily as needed for anxiety.    Marland Kitchen atorvastatin (LIPITOR) 40 MG tablet Take 40 mg by mouth daily.    . Cholecalciferol (VITAMIN D-3) 5000 UNITS TABS Take 1 tablet by mouth daily.    . dapagliflozin propanediol (FARXIGA) 5 MG TABS tablet Take by mouth.    . Dexlansoprazole (DEXILANT) 30 MG capsule Take 30 mg by mouth 2 (two) times daily.    Marland Kitchen doxycycline (VIBRA-TABS) 100 MG tablet Take 1 tablet (100 mg total) by mouth 2 (two) times daily. 20 tablet 0  . erythromycin ophthalmic ointment APPLY TO LIDS AT BEDTIME    . ezetimibe (ZETIA) 10 MG tablet Take 10 mg by mouth daily.    . furosemide (LASIX) 40 MG tablet Take 80 mg by mouth daily.    Marland Kitchen ipratropium-albuterol (DUONEB) 0.5-2.5 (3) MG/3ML SOLN Take 3 mLs by nebulization every 4 (four) hours as needed.    Marland Kitchen losartan (COZAAR) 100 MG tablet Take 100 mg by mouth daily.    Marland Kitchen lubiprostone (AMITIZA) 24 MCG capsule Take 24 mcg by mouth 2 (two) times  daily with a meal.    . metFORMIN (GLUCOPHAGE) 500 MG tablet Take by mouth.    . metoprolol tartrate (LOPRESSOR) 50 MG tablet TAKE 1 TABLET BY MOUTH TWICE A DAY    . mupirocin ointment (BACTROBAN) 2 % Apply once daily to toe 22 g 0  . neomycin-polymyxin-hydrocortisone (CORTISPORIN) OTIC solution Apply 2-3 drops to the ingrown toenail site twice daily. Cover with band-aid. 10 mL 0  . nitroGLYCERIN (NITROSTAT) 0.4 MG SL tablet Place 0.4 mg under the tongue every 5 (five) minutes as needed for chest pain.    Marland Kitchen PARoxetine (PAXIL) 20 MG tablet Take 20 mg by mouth daily.    . Vitamin D, Ergocalciferol, (DRISDOL) 1.25 MG (50000 UT) CAPS capsule Take by mouth.     No current facility-administered medications on file prior to visit.    Allergies  Allergen Reactions  . Sulfamethoxazole Hives  . Tape   . Latex Rash    Objective:  General: Well developed, nourished, in no acute distress, alert and oriented x3   Dermatology: Skin is warm, dry and supple bilateral. Left and right hallux and right 2nd toe medial/lateral nail bed appears to be clean, dry, with mild granular tissue and surrounding scab. (+) Blanchable erythema. (-) Edema. (-) serosanguous drainage present. The remaining nails appear unremarkable at this time. There are no other lesions or other signs of infection present.  Neurovascular status: Intact. No lower extremity swelling;  No pain with calf compression bilateral.  Musculoskeletal: Decreased tenderness to palpation of the Left and Right hallux and Right 2nd toe nail fold(s). Muscular strength within normal limits bilateral.   Assesement and Plan: Problem List Items Addressed This Visit    None    Visit Diagnoses    S/P nail surgery    -  Primary   Ingrown nail       Toe pain, bilateral       Diabetes mellitus without complication ([place])          -Examined patient  -Cleansed right/left hallux medial/lateral nail fold and right 2nd toenail and gently scrubbed with  peroxide and q-tip/curetted away eschar at site and applied antibiotic cream covered with bandaid.  -Discussed plan of care with patient. -Patient to now begin soaking in a weak solution of Epsom salt and warm water. Patient was instructed to soak for 15-20 minutes each day until the toe appears normal and there is no drainage, redness, tenderness, or swelling at the procedure site, and apply neosporin and a gauze or bandaid dressing each day as needed. May leave open to air at night. -Educated patient on long term care after nail surgery. -Patient was instructed to monitor the toe for reoccurrence and signs of infection; Patient advised to return to office or go to ER if toe becomes red, hot or swollen. -Patient is to return as needed or sooner if problems arise.  Landis Martins, DPM

## 2019-07-27 ENCOUNTER — Encounter: Payer: Self-pay | Admitting: Gastroenterology

## 2019-08-09 ENCOUNTER — Encounter: Payer: Self-pay | Admitting: Gastroenterology

## 2019-08-27 ENCOUNTER — Encounter: Payer: Self-pay | Admitting: Gastroenterology

## 2019-08-27 ENCOUNTER — Ambulatory Visit: Payer: Medicaid Other | Admitting: Gastroenterology

## 2019-08-27 ENCOUNTER — Other Ambulatory Visit: Payer: Self-pay

## 2019-08-27 VITALS — BP 124/82 | HR 62 | Temp 96.9°F | Ht 66.5 in | Wt 230.0 lb

## 2019-08-27 DIAGNOSIS — K581 Irritable bowel syndrome with constipation: Secondary | ICD-10-CM | POA: Diagnosis not present

## 2019-08-27 DIAGNOSIS — Z01818 Encounter for other preprocedural examination: Secondary | ICD-10-CM

## 2019-08-27 DIAGNOSIS — K625 Hemorrhage of anus and rectum: Secondary | ICD-10-CM

## 2019-08-27 DIAGNOSIS — K219 Gastro-esophageal reflux disease without esophagitis: Secondary | ICD-10-CM

## 2019-08-27 NOTE — Patient Instructions (Signed)
If you are age 62 or older, your body mass index should be between 23-30. Your Body mass index is 36.57 kg/m. If this is out of the aforementioned range listed, please consider follow up with your Primary Care Provider.  If you are age 22 or younger, your body mass index should be between 19-25. Your Body mass index is 36.57 kg/m. If this is out of the aformentioned range listed, please consider follow up with your Primary Care Provider.   You have been scheduled for an endoscopy and colonoscopy. Please follow the written instructions given to you at your visit today. Please pick up your prep supplies at the pharmacy within the next 1-3 days. If you use inhalers (even only as needed), please bring them with you on the day of your procedure. Your physician has requested that you go to www.startemmi.com and enter the access code given to you at your visit today. This web site gives a general overview about your procedure. However, you should still follow specific instructions given to you by our office regarding your preparation for the procedure.  Two days before your procedure: Mix 3 packs (or capfuls) of Miralax in 48 ounces of clear liquid and drink at 6pm.  Thank you,  Dr. Jackquline Denmark

## 2019-08-27 NOTE — Progress Notes (Signed)
Chief Complaint: Rectal bleeding  Referring Provider:  Nicoletta Dress, MD      ASSESSMENT AND PLAN;   #1. Rectal bleeding #2. IBS-C, now occ diarrhea. #3. GERD despite PPIs #4. Lower abdo pain   Plan: -Continue dexilant 30mg  po bid. -Linzess 147mcg po qd to continue. -Proceed with EGD/colon with 2 day prep. Discussed risks & benefits. (Risks including rare perforation req laparotomy, bleeding after bx/polypectomy req blood transfusion, rarely missing neoplasms, risks of anesthesia/sedation). Benefits outweigh the risks. Patient agrees to proceed. All the questions were answered. Consent forms given for review. -If still with problems and the above WU is negative, proceed with CT scan abdo/pelvis. -I have instructed patient to stop smoking.  Have discussed risks associated with smoking including risks of various cancers. -Please obtain previous records esp blood tests last month (CBC)      HPI:    Kara Olson is a 65 y.o. female  With rectal bleeding x 2 months intermittently, mostly bright red, at times mixed with the stool.  Has longstanding history of constipation.  Lately has been having diarrhea intermittently as well. Reflux with "air bubbles" despite Dexilant 30 twice daily. No Odynophagia or dysphagia.  Has lower abdominal crampy pain which gets better with defecation.  Does have abdominal bloating which gets better with defecation.  Has been using Linzess with some relief.  Occasionally, Linzess does cause diarrhea.  Continues to smoke despite medical advice.  No weight loss.  No melena.  No sodas, chocolates, chewing gums, artificial sweeteners and candy. No NSAIDs.  Seen by Dr. Elissa Hefty.  Advised to get repeat EGD and colonoscopy performed.  Past GI procedures: -Colonoscopy 01/2014 ([facility])-colonic polyp SP polypectomy, small internal hemorrhoids. 2012-tubular adenomas. -EGD 12/2012: neg with neg SB Bx, CLO Past Medical History:  Diagnosis Date  .  ADHD   . Anginal pain ([place])   . Anxiety and depression   . Arthropathy   . Asthma   . Atherosclerosis   . CAD (coronary artery disease)   . Diabetes ([place])   . Elevated cholesterol   . Gallstones   . GERD (gastroesophageal reflux disease)   . History of colon polyps   . Hypertension   . IBS (irritable bowel syndrome)   . OSA (obstructive sleep apnea)    on CPAP  . Pneumonia   . Restless leg syndrome   . Seizures ([place])     Past Surgical History:  Procedure Laterality Date  . ABDOMINAL HYSTERECTOMY    . APPENDECTOMY  1980  . CARPAL TUNNEL RELEASE    . CERVICAL FUSION  2001  . CHOLECYSTECTOMY  1997  . COLONOSCOPY  01/17/2014   Colon polyp-status post polypectomy. Small internal hemorrhoids. Otherwise normal colonoscopy to the terminal ileum. The colon was somewhat redundant  . ESOPHAGOGASTRODUODENOSCOPY  12/21/2012   Normal EGD  . TOTAL ABDOMINAL HYSTERECTOMY W/ BILATERAL SALPINGOOPHORECTOMY    . TRIGGER FINGER RELEASE     thumb  . VESICOVAGINAL FISTULA CLOSURE W/ TAH  1980    Family History  Problem Relation Age of Onset  . Heart disease Father   . Heart disease Brother   . Breast cancer Maternal Aunt   . Colon cancer Neg Hx   . Esophageal cancer Neg Hx     Social History   Tobacco Use  . Smoking status: Current Every Day Smoker    Packs/day: 0.50    Years: 34.00    Pack years: 17.00    Types: Cigarettes  . Smokeless  tobacco: Never Used  Substance Use Topics  . Alcohol use: No  . Drug use: No    Current Outpatient Medications  Medication Sig Dispense Refill  . albuterol (PROAIR HFA) 108 (90 Base) MCG/ACT inhaler Inhale 1 puff into the lungs as needed for wheezing or shortness of breath.    . ALPRAZolam (XANAX) 0.5 MG tablet Take 0.5 mg by mouth 3 (three) times daily as needed for anxiety.    Marland Kitchen atorvastatin (LIPITOR) 40 MG tablet Take 40 mg by mouth daily.    . budesonide-formoterol (SYMBICORT) 160-4.5 MCG/ACT inhaler Inhale 2 puffs into the lungs daily.     . Cholecalciferol (VITAMIN D-3) 5000 UNITS TABS Take 1 tablet by mouth daily.    Marland Kitchen Dexlansoprazole (DEXILANT) 30 MG capsule Take 30 mg by mouth 2 (two) times daily.    Marland Kitchen erythromycin ophthalmic ointment APPLY TO LIDS AT BEDTIME    . ezetimibe (ZETIA) 10 MG tablet Take 10 mg by mouth daily.    . furosemide (LASIX) 40 MG tablet Take 80 mg by mouth daily.    . IBUPROFEN PO Take 1 tablet by mouth 3 (three) times daily as needed.    Marland Kitchen ipratropium-albuterol (DUONEB) 0.5-2.5 (3) MG/3ML SOLN Take 3 mLs by nebulization every 4 (four) hours as needed.    . linaclotide (LINZESS) 145 MCG CAPS capsule Take 145 mcg by mouth daily before breakfast.    . linagliptin (TRADJENTA) 5 MG TABS tablet Take 5 mg by mouth daily.    . metFORMIN (GLUCOPHAGE) 500 MG tablet Take 500 mg by mouth 2 (two) times daily.     . metoprolol tartrate (LOPRESSOR) 50 MG tablet TAKE 1 TABLET BY MOUTH TWICE A DAY    . mupirocin ointment (BACTROBAN) 2 % Apply once daily to toe 22 g 0  . neomycin-polymyxin-hydrocortisone (CORTISPORIN) OTIC solution Apply 2-3 drops to the ingrown toenail site twice daily. Cover with band-aid. (Patient taking differently: as needed. Apply 2-3 drops to the ingrown toenail site twice daily. Cover with band-aid.) 10 mL 0  . nitroGLYCERIN (NITROSTAT) 0.4 MG SL tablet Place 0.4 mg under the tongue every 5 (five) minutes as needed for chest pain.    Marland Kitchen PARoxetine (PAXIL) 20 MG tablet Take 20 mg by mouth daily.    . valsartan (DIOVAN) 320 MG tablet Take 320 mg by mouth daily.     No current facility-administered medications for this visit.    Allergies  Allergen Reactions  . Sulfamethoxazole Hives  . Tape   . Latex Rash    Review of Systems:  Constitutional: Denies fever, chills, diaphoresis, appetite change and fatigue.  HEENT: Has multiple allergies.Marland Kitchen   Respiratory: Denies SOB, DOE, cough, chest tightness,  and wheezing.  Occasional shortness of breath.  Has sleep apnea.  Continues to smoke.  Uses  inhalers Cardiovascular: Denies chest pain, palpitations and leg swelling.  Genitourinary: Denies dysuria, urgency, frequency, hematuria, flank pain and difficulty urinating.  Musculoskeletal: Denies myalgias, back pain, joint swelling, arthralgias and gait problem.  Skin: No rash.  Neurological: Denies dizziness, seizures, syncope, weakness, light-headedness, numbness and headaches.  Hematological: Denies adenopathy. Easy bruising, personal or family bleeding history  Psychiatric/Behavioral: Has anxiety or depression     Physical Exam:    BP 124/82   Pulse 62   Temp (!) 96.9 F (36.1 C)   Ht 5' 6.5" (1.689 m)   Wt 230 lb (104.3 kg)   BMI 36.57 kg/m  Wt Readings from Last 3 Encounters:  08/27/19 230 lb (104.3 kg)  07/25/18 230 lb (104.3 kg)  04/09/14 249 lb (112.9 kg)   Constitutional:  Well-developed, in no acute distress. Psychiatric: Normal mood and affect. Behavior is normal. HEENT: Pupils normal.  Conjunctivae are normal. No scleral icterus. Neck supple.  Cardiovascular: Normal rate, regular rhythm. No edema Pulmonary/chest: Bilateral decreased breath sounds. Abdominal: Soft, nondistended. Nontender. Bowel sounds active throughout. There are no masses palpable. No hepatomegaly. Rectal: To be performed at the time of colonoscopy. Neurological: Alert and oriented to person place and time. Skin: Skin is warm and dry. No rashes noted.  Data Reviewed: I have personally reviewed following labs and imaging studies  CBC: CBC Latest Ref Rng & Units 03/31/2009 02/17/2009  WBC 4.0 - 10.5 K/uL 9.7 10.6(H)  Hemoglobin 12.0 - 15.0 g/dL 15.1(H) 15.0  Hematocrit 36.0 - 46.0 % 43.3 43.8  Platelets 150 - 400 K/uL 218 221    CMP: CMP Latest Ref Rng & Units 03/31/2009 02/17/2009  Glucose 70 - 99 mg/dL 101(H) 106(H)  BUN 6 - 23 mg/dL 16 20  Creatinine 0.4 - 1.2 mg/dL 0.89 0.93  Sodium 135 - 145 mEq/L 137 137  Potassium 3.5 - 5.1 mEq/L 4.1 4.3  Chloride 96 - 112 mEq/L 101 100  CO2  19 - 32 mEq/L 27 28  Calcium 8.4 - 10.5 mg/dL 9.7 9.8     Carmell Austria, MD 08/27/2019, 9:05 AM  Cc: Nicoletta Dress, MD

## 2019-09-10 ENCOUNTER — Other Ambulatory Visit: Payer: Self-pay | Admitting: Gastroenterology

## 2019-09-10 ENCOUNTER — Ambulatory Visit (INDEPENDENT_AMBULATORY_CARE_PROVIDER_SITE_OTHER): Payer: Medicaid Other

## 2019-09-10 DIAGNOSIS — Z1159 Encounter for screening for other viral diseases: Secondary | ICD-10-CM

## 2019-09-10 LAB — SARS CORONAVIRUS 2 (TAT 6-24 HRS): SARS Coronavirus 2: NEGATIVE

## 2019-09-12 ENCOUNTER — Encounter: Payer: Self-pay | Admitting: Gastroenterology

## 2019-09-12 ENCOUNTER — Ambulatory Visit (AMBULATORY_SURGERY_CENTER): Payer: Medicaid Other | Admitting: Gastroenterology

## 2019-09-12 ENCOUNTER — Other Ambulatory Visit: Payer: Self-pay

## 2019-09-12 VITALS — BP 105/47 | HR 52 | Temp 97.7°F | Resp 21 | Ht 66.0 in | Wt 230.0 lb

## 2019-09-12 DIAGNOSIS — K625 Hemorrhage of anus and rectum: Secondary | ICD-10-CM

## 2019-09-12 DIAGNOSIS — R197 Diarrhea, unspecified: Secondary | ICD-10-CM | POA: Diagnosis not present

## 2019-09-12 DIAGNOSIS — K2950 Unspecified chronic gastritis without bleeding: Secondary | ICD-10-CM

## 2019-09-12 DIAGNOSIS — D124 Benign neoplasm of descending colon: Secondary | ICD-10-CM

## 2019-09-12 DIAGNOSIS — K219 Gastro-esophageal reflux disease without esophagitis: Secondary | ICD-10-CM

## 2019-09-12 DIAGNOSIS — D125 Benign neoplasm of sigmoid colon: Secondary | ICD-10-CM | POA: Diagnosis not present

## 2019-09-12 DIAGNOSIS — K648 Other hemorrhoids: Secondary | ICD-10-CM | POA: Diagnosis not present

## 2019-09-12 DIAGNOSIS — D128 Benign neoplasm of rectum: Secondary | ICD-10-CM | POA: Diagnosis not present

## 2019-09-12 MED ORDER — SODIUM CHLORIDE 0.9 % IV SOLN: 500.0000 mL | Freq: Once | INTRAVENOUS | Status: DC

## 2019-09-12 MED ORDER — HYDROCORTISONE (PERIANAL) 2.5 % EX CREA: 1.0000 "application " | TOPICAL_CREAM | Freq: Two times a day (BID) | CUTANEOUS | 1 refills | Status: DC

## 2019-09-12 NOTE — Op Note (Signed)
[name] Procedure Date: 09/12/2019 8:01 AM MRN: [id number] Endoscopist: Jackquline Denmark , MD Age: 64 Referring MD:  Date of Birth: 1956/04/28 Gender: Female Account #: [id number] Procedure:                Colonoscopy Indications:              Rectal bleeding. Previous history of constipation                            now with occasional diarrhea. Medicines:                Monitored Anesthesia Care Procedure:                Pre-Anesthesia Assessment:                           - Prior to the procedure, a History and Physical                            was performed, and patient medications and                            allergies were reviewed. The patient's tolerance of                            previous anesthesia was also reviewed. The risks                            and benefits of the procedure and the sedation                            options and risks were discussed with the patient.                            All questions were answered, and informed consent                            was obtained. Prior Anticoagulants: The patient has                            taken no previous anticoagulant or antiplatelet                            agents. ASA Grade Assessment: III - A patient with                            severe systemic disease. After reviewing the risks                            and benefits, the patient was deemed in                            satisfactory condition to undergo the procedure.  After obtaining informed consent, the colonoscope                            was passed under direct vision. Throughout the                            procedure, the patient's blood pressure, pulse, and                            oxygen saturations were monitored continuously. The                            Colonoscope was introduced through the anus and                            advanced to the 2 cm into the  ileum. The                            colonoscopy was performed without difficulty. The                            patient tolerated the procedure well. The quality                            of the bowel preparation was good. The terminal                            ileum, ileocecal valve, appendiceal orifice, and                            rectum were photographed. Scope In: 8:21:33 AM Scope Out: 8:41:13 AM Scope Withdrawal Time: 0 hours 14 minutes 43 seconds  Total Procedure Duration: 0 hours 19 minutes 40 seconds  Findings:                 Three sessile polyps were found in the rectum,                            sigmoid colon and descending colon. The polyps were                            6 to 8 mm in size. These polyps were removed with a                            cold snare. Resection and retrieval were complete.                           The colon (entire examined portion) appeared normal                            with well preserved vascular pattern. Biopsies for                            histology were  taken with a cold forceps for                            evaluation of microscopic colitis.                           Non-bleeding internal hemorrhoids were found during                            retroflexion. The hemorrhoids were moderate.                           The terminal ileum appeared normal.                           The exam was otherwise without abnormality on                            direct and retroflexion views. Complications:            No immediate complications. Estimated Blood Loss:     Estimated blood loss: none. Impression:               - Three 6 to 8 mm polyps in the rectum, in the                            sigmoid colon and in the descending colon, removed                            with a cold snare. Resected and retrieved.                           - Moderate internal hemorrhoids.                           - Otherwise normal colonoscopy to  TI. Recommendation:           - Patient has a contact number available for                            emergencies. The signs and symptoms of potential                            delayed complications were discussed with the                            patient. Return to normal activities tomorrow.                            Written discharge instructions were provided to the                            patient.                           - Resume previous diet.                           -  Continue present medications.                           - Await pathology results.                           - Repeat colonoscopy for surveillance based on                            pathology results.                           - Use HC Cream 2.5%: Apply externally BID for 10                            days.                           - FU in 12 weeks.                           - D/W John. Jackquline Denmark, MD 09/12/2019 8:52:48 AM This report has been signed electronically.

## 2019-09-12 NOTE — Op Note (Signed)
[facility] Patient Name: Kara Olson Procedure Date: 09/12/2019 8:02 AM MRN: [id number] Endoscopist: Jackquline Denmark , MD Age: 64 Referring MD:  Date of Birth: June 28, 1955 Gender: Female Account #: [id number] Procedure:                Upper GI endoscopy Indications:              Epigastric abdominal pain, GERD Medicines:                Monitored Anesthesia Care Procedure:                Pre-Anesthesia Assessment:                           - Prior to the procedure, a History and Physical                            was performed, and patient medications and                            allergies were reviewed. The patient's tolerance of                            previous anesthesia was also reviewed. The risks                            and benefits of the procedure and the sedation                            options and risks were discussed with the patient.                            All questions were answered, and informed consent                            was obtained. Prior Anticoagulants: The patient has                            taken no previous anticoagulant or antiplatelet                            agents. ASA Grade Assessment: III - A patient with                            severe systemic disease. After reviewing the risks                            and benefits, the patient was deemed in                            satisfactory condition to undergo the procedure.                           After obtaining informed consent, the endoscope was  passed under direct vision. Throughout the                            procedure, the patient's blood pressure, pulse, and                            oxygen saturations were monitored continuously. The                            Endoscope was introduced through the mouth, and                            advanced to the second part of duodenum. The upper                            GI endoscopy was  accomplished without difficulty.                            The patient tolerated the procedure well. Scope In: Scope Out: Findings:                 The examined esophagus was normal. Z-line                            well-defined at 40 cm. Examined by NBI. Biopsies                            were taken with a cold forceps for histology.                           Localized mild inflammation characterized by                            erythema was found in the gastric antrum. Biopsies                            were taken with a cold forceps for histology. Some                            retained food in the stomach without outlet                            obstruction.                           The examined duodenum was normal. Biopsies for                            histology were taken with a cold forceps for                            evaluation of celiac disease. Complications:            No immediate complications. Estimated Blood Loss:     Estimated blood loss: none. Impression:               -  Mild gastritis.                           -Some retained food without outlet obstruction. Recommendation:           - Patient has a contact number available for                            emergencies. The signs and symptoms of potential                            delayed complications were discussed with the                            patient. Return to normal activities tomorrow.                            Written discharge instructions were provided to the                            patient.                           - Resume previous diet.                           - Continue present medications.                           - Await pathology results.                           - Return to GI clinic in 12 weeks. If still with                            problems, would consider solid-phase gastric                            emptying scan to rule out gastroparesis. Jackquline Denmark, MD 09/12/2019  8:47:44 AM This report has been signed electronically.

## 2019-09-12 NOTE — Progress Notes (Signed)
A and O x3. Report to RN. Tolerated MAC anesthesia well.Teeth unchanged after procedure.

## 2019-09-12 NOTE — Progress Notes (Signed)
Called to room to assist during endoscopic procedure.  Patient ID and intended procedure confirmed with present staff. Received instructions for my participation in the procedure from the performing physician.  

## 2019-09-12 NOTE — Patient Instructions (Signed)
Information on polyps, gastritis and hemorrhoids given to you today.  Await pathology results.  Return to GI clinic in 12 weeks.  Use hydrocortisone cream 2.5%.  Apply externally twice a day for 10 days.  YOU HAD AN ENDOSCOPIC PROCEDURE TODAY AT [facility]:   Refer to the procedure report that was given to you for any specific questions about what was found during the examination.  If the procedure report does not answer your questions, please call your gastroenterologist to clarify.  If you requested that your care partner not be given the details of your procedure findings, then the procedure report has been included in a sealed envelope for you to review at your convenience later.  YOU SHOULD EXPECT: Some feelings of bloating in the abdomen. Passage of more gas than usual.  Walking can help get rid of the air that was put into your GI tract during the procedure and reduce the bloating. If you had a lower endoscopy (such as a colonoscopy or flexible sigmoidoscopy) you may notice spotting of blood in your stool or on the toilet paper. If you underwent a bowel prep for your procedure, you may not have a normal bowel movement for a few days.  Please Note:  You might notice some irritation and congestion in your nose or some drainage.  This is from the oxygen used during your procedure.  There is no need for concern and it should clear up in a day or so.  SYMPTOMS TO REPORT IMMEDIATELY:   Following lower endoscopy (colonoscopy or flexible sigmoidoscopy):  Excessive amounts of blood in the stool  Significant tenderness or worsening of abdominal pains  Swelling of the abdomen that is new, acute  Fever of 100F or higher   Following upper endoscopy (EGD)  Vomiting of blood or coffee ground material  New chest pain or pain under the shoulder blades  Painful or persistently difficult swallowing  New shortness of breath  Fever of 100F or higher  Black, tarry-looking  stools  For urgent or emergent issues, a gastroenterologist can be reached at any hour by calling [phone]. Do not use MyChart messaging for urgent concerns.    DIET:  We do recommend a small meal at first, but then you may proceed to your regular diet.  Drink plenty of fluids but you should avoid alcoholic beverages for 24 hours.  ACTIVITY:  You should plan to take it easy for the rest of today and you should NOT DRIVE or use heavy machinery until tomorrow (because of the sedation medicines used during the test).    FOLLOW UP: Our staff will call the number listed on your records 48-72 hours following your procedure to check on you and address any questions or concerns that you may have regarding the information given to you following your procedure. If we do not reach you, we will leave a message.  We will attempt to reach you two times.  During this call, we will ask if you have developed any symptoms of COVID 19. If you develop any symptoms (ie: fever, flu-like symptoms, shortness of breath, cough etc.) before then, please call [phone].  If you test positive for Covid 19 in the 2 weeks post procedure, please call and report this information to [place].    If any biopsies were taken you will be contacted by phone or by letter within the next 1-3 weeks.  Please call us at [phone] if you have not heard about the biopsies  in 3 weeks.    SIGNATURES/CONFIDENTIALITY: You and/or your care partner have signed paperwork which will be entered into your electronic medical record.  These signatures attest to the fact that that the information above on your After Visit Summary has been reviewed and is understood.  Full responsibility of the confidentiality of this discharge information lies with you and/or your care-partner.

## 2019-09-12 NOTE — Progress Notes (Signed)
Pt's states no medical or surgical changes since previsit or office visit. 

## 2019-09-17 ENCOUNTER — Telehealth: Payer: Self-pay

## 2019-09-17 NOTE — Telephone Encounter (Signed)
  Follow up Call-  Call back number 09/12/2019  Post procedure Call Back phone  # [phone] cell  Permission to leave phone message Yes  Some recent data might be hidden     Patient questions:  Do you have a fever, pain , or abdominal swelling? No. Pain Score  0 *  Have you tolerated food without any problems? Yes.    Have you been able to return to your normal activities? Yes.    Do you have any questions about your discharge instructions: Diet   No. Medications  No. Follow up visit  No.  Do you have questions or concerns about your Care? No.  Actions: * If pain score is 4 or above: No action needed, pain <4.  1. Have you developed a fever since your procedure? no  2.   Have you had an respiratory symptoms (SOB or cough) since your procedure? no  3.   Have you tested positive for COVID 19 since your procedure no  4.   Have you had any family members/close contacts diagnosed with the COVID 19 since your procedure?  no   If yes to any of these questions please route to Joylene John, RN and Erenest Rasher, RN

## 2019-09-29 ENCOUNTER — Encounter: Payer: Self-pay | Admitting: Gastroenterology

## 2020-09-28 DIAGNOSIS — R001 Bradycardia, unspecified: Secondary | ICD-10-CM | POA: Diagnosis not present

## 2020-09-28 DIAGNOSIS — I340 Nonrheumatic mitral (valve) insufficiency: Secondary | ICD-10-CM

## 2020-10-28 DIAGNOSIS — R55 Syncope and collapse: Secondary | ICD-10-CM

## 2020-10-28 HISTORY — DX: Syncope and collapse: R55

## 2021-03-22 ENCOUNTER — Encounter: Payer: Self-pay | Admitting: *Deleted

## 2021-03-22 ENCOUNTER — Encounter: Payer: Self-pay | Admitting: Cardiology

## 2021-03-29 DIAGNOSIS — Z860100 Personal history of colon polyps, unspecified: Secondary | ICD-10-CM

## 2021-03-29 DIAGNOSIS — G2581 Restless legs syndrome: Secondary | ICD-10-CM | POA: Insufficient documentation

## 2021-03-29 DIAGNOSIS — E119 Type 2 diabetes mellitus without complications: Secondary | ICD-10-CM

## 2021-03-29 DIAGNOSIS — G4733 Obstructive sleep apnea (adult) (pediatric): Secondary | ICD-10-CM

## 2021-03-29 DIAGNOSIS — M129 Arthropathy, unspecified: Secondary | ICD-10-CM | POA: Insufficient documentation

## 2021-03-29 DIAGNOSIS — I209 Angina pectoris, unspecified: Secondary | ICD-10-CM

## 2021-03-29 DIAGNOSIS — F419 Anxiety disorder, unspecified: Secondary | ICD-10-CM | POA: Insufficient documentation

## 2021-03-29 DIAGNOSIS — K589 Irritable bowel syndrome without diarrhea: Secondary | ICD-10-CM

## 2021-03-29 DIAGNOSIS — F32A Anxiety disorder, unspecified: Secondary | ICD-10-CM | POA: Insufficient documentation

## 2021-03-29 DIAGNOSIS — K219 Gastro-esophageal reflux disease without esophagitis: Secondary | ICD-10-CM | POA: Insufficient documentation

## 2021-03-29 DIAGNOSIS — I7090 Unspecified atherosclerosis: Secondary | ICD-10-CM | POA: Insufficient documentation

## 2021-03-29 DIAGNOSIS — R569 Unspecified convulsions: Secondary | ICD-10-CM | POA: Insufficient documentation

## 2021-03-29 DIAGNOSIS — J45909 Unspecified asthma, uncomplicated: Secondary | ICD-10-CM | POA: Insufficient documentation

## 2021-03-29 DIAGNOSIS — J189 Pneumonia, unspecified organism: Secondary | ICD-10-CM | POA: Insufficient documentation

## 2021-03-29 DIAGNOSIS — F909 Attention-deficit hyperactivity disorder, unspecified type: Secondary | ICD-10-CM

## 2021-03-29 DIAGNOSIS — K8020 Calculus of gallbladder without cholecystitis without obstruction: Secondary | ICD-10-CM

## 2021-03-29 DIAGNOSIS — F418 Other specified anxiety disorders: Secondary | ICD-10-CM

## 2021-03-29 HISTORY — DX: Irritable bowel syndrome, unspecified: K58.9

## 2021-03-29 HISTORY — DX: Calculus of gallbladder without cholecystitis without obstruction: K80.20

## 2021-03-29 HISTORY — DX: Angina pectoris, unspecified: I20.9

## 2021-03-29 HISTORY — DX: Obstructive sleep apnea (adult) (pediatric): G47.33

## 2021-03-29 HISTORY — DX: Gastro-esophageal reflux disease without esophagitis: K21.9

## 2021-03-29 HISTORY — DX: Unspecified convulsions: R56.9

## 2021-03-29 HISTORY — DX: Type 2 diabetes mellitus without complications: E11.9

## 2021-03-29 HISTORY — DX: Attention-deficit hyperactivity disorder, unspecified type: F90.9

## 2021-03-29 HISTORY — DX: Personal history of colon polyps, unspecified: Z86.0100

## 2021-03-29 HISTORY — DX: Other specified anxiety disorders: F41.8

## 2021-03-31 NOTE — Progress Notes (Signed)
Cardiology Office Note:    Date:  04/01/2021   ID:  NGUYEN BUTLER, DOB 03-28-1956, MRN [id number]  PCP:  Nicoletta Dress, MD  Cardiologist:  Shirlee More, MD   Referring MD: Nicoletta Dress, MD  ASSESSMENT:    1. Aortic valve calcification   2. Essential hypertension   3. Mild CAD   4. Mixed hyperlipidemia    PLAN:    In order of problems listed above:  From a cardiology perspective she is doing well although the aortic valve is calcified on CT she has no valvular dysfunction I reviewed her recent echocardiogram done at [facility]. Stable continue current antihypertensive medications BP at target Stable asymptomatic continue medical therapy she is on aspirin and high intensity statin.  LDL is at target  Next appointment 1 year   Medication Adjustments/Labs and Tests Ordered: Current medicines are reviewed at length with the patient today.  Concerns regarding medicines are outlined above.  Orders Placed This Encounter  Procedures   EKG 12-Lead   ECHOCARDIOGRAM COMPLETE   No orders of the defined types were placed in this encounter.     Chief complaint: Referred because of abnormal CT scan My aortic valve is calcified  History of Present Illness:    Ota A Chandra is a 65 y.o. female who is being seen today to reestablish cardiology care the request of Katheran Awe nurse practitioner at [facility] pulmonary and sleep clinic  She was seen by me [facility] regional cardiology 07/11/2016 with a history of hypertension dyslipidemia and mild nonobstructive CAD.  At that time she was referred to quit [employer] for cigarette smoking.  She is followed by pulmonary in [place] for asthma and obstructive sleep apnea.  She is referred here today she had a CT of her chest 02/03/2021 with atherosclerosis and calcification noted in the coronary vessels aorta and great vessels and calcification of the aortic valve.  She had an echocardiogram performed at [facility] 12/24/2020  she had mild concentric LVH normal left ventricular systolic function EF 55 to 00% grade 1 diastolic dysfunction.  Right ventricle is normal in size and function.  Aortic valve was normal and there is no stenosis.  EKG showed sinus rhythm nonspecific T wave abnormality.  She had a myocardial perfusion study performed at [facility] 12/18/2018 pharmacologically with Lexiscan EF 69% normal perfusion.  I know her very well from caring for her husband. The coronary angiogram was before 2019 She denies good medical treatment including aspirin she takes 81 mg daily along with lipid-lowering. She has had no angina palpitations syncope or edema. She has obstructive sleep apnea very compliant with CPAP and is improved the quality of her life.  She does not have excessive daytime sleepiness or fatigue. She continues to have asthma and she is short of breath with activities like heavy housework or walking longer distances into a store. She has no known history of valvular heart disease.  She had an echocardiogram at [facility] this year that showed no valvular dysfunction 3 leaflet valve Past Medical History:  Diagnosis Date   ADHD    Anginal pain (HCC)    Anxiety and depression    Arthropathy    Asthma    Atherosclerosis    CAD (coronary artery disease)    Diabetes ([place])    Dyspnea 04/09/2014   Sept 29 2015 PFT's  fev1 1.68 (82%) ratio 83 and DLCO 80 and no change p saba    Elevated cholesterol    Essential  hypertension 01/07/2016   Gallstones    GERD (gastroesophageal reflux disease)    History of colon polyps    Hyperlipidemia 01/07/2016   Hypertension    IBS (irritable bowel syndrome)    Mild CAD 01/07/2016   OSA (obstructive sleep apnea)    on CPAP   Pneumonia    Restless leg syndrome    Seizures ([place])    Sleep apnea 01/07/2016   Overview:  With pseudo heart failure   Syncope 10/28/2020   Upper airway cough syndrome 04/09/2014   Followed in Pulmonary clinic/ [facility]/  [place] - try off acei 04/09/2014       Past Surgical History:  Procedure Laterality Date   ABDOMINAL HYSTERECTOMY     APPENDECTOMY  1980   CARPAL TUNNEL RELEASE     CERVICAL FUSION  2001   CHOLECYSTECTOMY  1997   COLONOSCOPY  01/17/2014   Colon polyp-status post polypectomy. Small internal hemorrhoids. Otherwise normal colonoscopy to the terminal ileum. The colon was somewhat redundant   ESOPHAGOGASTRODUODENOSCOPY  12/21/2012   Normal EGD   TOTAL ABDOMINAL HYSTERECTOMY W/ BILATERAL SALPINGOOPHORECTOMY     TRIGGER FINGER RELEASE     thumb   VESICOVAGINAL FISTULA CLOSURE W/ TAH  1980    Current Medications: Current Meds  Medication Sig   albuterol (PROVENTIL) (2.5 MG/3ML) 0.083% nebulizer solution Take 2.5 mg by nebulization every 6 (six) hours as needed for wheezing or shortness of breath.   albuterol (VENTOLIN HFA) 108 (90 Base) MCG/ACT inhaler Inhale 1 puff into the lungs every 6 (six) hours as needed for wheezing or shortness of breath.   ALPRAZolam (XANAX) 1 MG tablet Take 1 mg by mouth at bedtime as needed for anxiety.   atorvastatin (LIPITOR) 40 MG tablet Take 40 mg by mouth daily.   budesonide-formoterol (SYMBICORT) 160-4.5 MCG/ACT inhaler Inhale 2 puffs into the lungs daily.   Cholecalciferol (VITAMIN D-3) 5000 UNITS TABS Take 1 tablet by mouth daily.   clobetasol (TEMOVATE) 0.05 % external solution Apply 1 application topically 2 (two) times daily as needed for rash.   Dexlansoprazole 30 MG capsule Take 30 mg by mouth 2 (two) times daily.   escitalopram (LEXAPRO) 10 MG tablet Take 10 mg by mouth daily.   ezetimibe (ZETIA) 10 MG tablet Take 10 mg by mouth daily.   ketoconazole (NIZORAL) 2 % shampoo Apply 1 application topically 3 (three) times a week.   lubiprostone (AMITIZA) 24 MCG capsule Take 24 mcg by mouth 2 (two) times daily with a meal.   metFORMIN (GLUCOPHAGE) 500 MG tablet Take 500 mg by mouth 2 (two) times daily.   metoprolol tartrate (LOPRESSOR) 50 MG tablet Take  50 mg by mouth daily.   mometasone-formoterol (DULERA) 200-5 MCG/ACT AERO Inhale 2 puffs into the lungs 2 (two) times daily.   montelukast (SINGULAIR) 10 MG tablet Take 10 mg by mouth at bedtime.   nitroGLYCERIN (NITROSTAT) 0.4 MG SL tablet Place 0.4 mg under the tongue every 5 (five) minutes as needed for chest pain.   PARoxetine (PAXIL) 40 MG tablet Take 40 mg by mouth daily.   promethazine (PHENERGAN) 25 MG tablet Take 25 mg by mouth every 8 (eight) hours as needed for nausea/vomiting.   Tiotropium Bromide Monohydrate (SPIRIVA RESPIMAT) 1.25 MCG/ACT AERS Inhale 1 puff into the lungs daily.   valsartan (DIOVAN) 160 MG tablet Take 160 mg by mouth daily.     Allergies:   Nsaids, Sulfamethoxazole, Tape, and Latex   Social History   Socioeconomic History  Marital status: Married    Spouse name: Not on file   Number of children: Not on file   Years of education: Not on file   Highest education level: Not on file  Occupational History   Occupation: Unemployed  Tobacco Use   Smoking status: Every Day    Packs/day: 0.50    Years: 34.00    Pack years: 17.00    Types: Cigarettes   Smokeless tobacco: Never  Vaping Use   Vaping Use: Never used  Substance and Sexual Activity   Alcohol use: No   Drug use: No   Sexual activity: Not on file  Other Topics Concern   Not on file  Social History Narrative   Not on file   Social Determinants of Health   Financial Resource Strain: Not on file  Food Insecurity: Not on file  Transportation Needs: Not on file  Physical Activity: Not on file  Stress: Not on file  Social Connections: Not on file     Family History: The patient's family history includes Breast cancer in her maternal aunt; Heart disease in her brother and father. There is no history of Colon cancer or Esophageal cancer.  ROS:   ROS Please see the history of present illness.     All other systems reviewed and are negative.  EKGs/Labs/Other Studies Reviewed:    The  following studies were reviewed today:   EKG:  EKG is  ordered today.  The ekg ordered today is personally reviewed and demonstrates sinus rhythm normal EKG  Recent Labs: No results found for requested labs within last 8760 hours.  02/27/2021: Cholesterol 157 LDL 64 triglycerides 388 HDL 34 A1c 7.2% creatinine 1.26  Physical Exam:    VS:  BP 134/82   Pulse 66   Ht 5\' 5"  (1.651 m)   Wt 226 lb 3.2 oz (102.6 kg)   SpO2 95%   BMI 37.64 kg/m     Wt Readings from Last 3 Encounters:  04/01/21 226 lb 3.2 oz (102.6 kg)  02/16/21 222 lb 3.2 oz (100.8 kg)  09/12/19 230 lb (104.3 kg)     GEN: Obese BMI over 37 well nourished, well developed in no acute distress HEENT: Normal NECK: No JVD; No carotid bruits LYMPHATICS: No lymphadenopathy CARDIAC: Hyperinflated chest distant heart sounds RRR, no murmurs, rubs, gallops RESPIRATORY: Barrel chested hyperinflated expiratory wheezing and rhonchi ABDOMEN: Soft, non-tender, non-distended MUSCULOSKELETAL:  No edema; No deformity  SKIN: Warm and dry NEUROLOGIC:  Alert and oriented x 3 PSYCHIATRIC:  Normal affect     Signed, Shirlee More, MD  04/01/2021 1:59 PM    [facility]

## 2021-04-01 ENCOUNTER — Encounter: Payer: Self-pay | Admitting: Cardiology

## 2021-04-01 ENCOUNTER — Ambulatory Visit: Payer: Medicaid Other | Admitting: Cardiology

## 2021-04-01 ENCOUNTER — Other Ambulatory Visit: Payer: Self-pay

## 2021-04-01 VITALS — BP 134/82 | HR 66 | Ht 65.0 in | Wt 226.2 lb

## 2021-04-01 DIAGNOSIS — I359 Nonrheumatic aortic valve disorder, unspecified: Secondary | ICD-10-CM

## 2021-04-01 DIAGNOSIS — I10 Essential (primary) hypertension: Secondary | ICD-10-CM | POA: Diagnosis not present

## 2021-04-01 DIAGNOSIS — E782 Mixed hyperlipidemia: Secondary | ICD-10-CM

## 2021-04-01 DIAGNOSIS — I2510 Atherosclerotic heart disease of native coronary artery without angina pectoris: Secondary | ICD-10-CM | POA: Diagnosis not present

## 2021-04-01 NOTE — Patient Instructions (Signed)
Medication Instructions:  Your physician recommends that you continue on your current medications as directed. Please refer to the Current Medication list given to you today.  *If you need a refill on your cardiac medications before your next appointment, please call your pharmacy*   Lab Work: None If you have labs (blood work) drawn today and your tests are completely normal, you will receive your results only by: MyChart Message (if you have MyChart) OR A paper copy in the mail If you have any lab test that is abnormal or we need to change your treatment, we will call you to review the results.   Testing/Procedures: Your physician has requested that you have an echocardiogram. Echocardiography is a painless test that uses sound waves to create images of your heart. It provides your doctor with information about the size and shape of your heart and how well your heart's chambers and valves are working. This procedure takes approximately one hour. There are no restrictions for this procedure.    Follow-Up: At [facility], you and your health needs are our priority.  As part of our continuing mission to provide you with exceptional heart care, we have created designated Provider Care Teams.  These Care Teams include your primary Cardiologist (physician) and Advanced Practice Providers (APPs -  Physician Assistants and Nurse Practitioners) who all work together to provide you with the care you need, when you need it.  We recommend signing up for the patient portal called "MyChart".  Sign up information is provided on this After Visit Summary.  MyChart is used to connect with patients for Virtual Visits (Telemedicine).  Patients are able to view lab/test results, encounter notes, upcoming appointments, etc.  Non-urgent messages can be sent to your provider as well.   To learn more about what you can do with MyChart, go to [email or web address].    Your next appointment:   1 year(s)  The  format for your next appointment:   In Person  Provider:   Brian Munley, MD   Other Instructions   

## 2021-04-08 ENCOUNTER — Telehealth: Payer: Self-pay | Admitting: Cardiology

## 2021-04-08 NOTE — Telephone Encounter (Signed)
Husband of the patient called. The husband wanted to know why the patient is scheduled for another Echo. The husband said she just had one done at Dr. Maxie Barb office within the past month

## 2021-04-08 NOTE — Telephone Encounter (Signed)
Spoke to the patients husband just now and let him know that he is correct. She does not need this echo at this time and it was canceled for them. He verbalizes understanding.

## 2021-04-26 ENCOUNTER — Other Ambulatory Visit: Payer: Medicaid Other

## 2022-03-31 NOTE — Progress Notes (Signed)
Cardiology Office Note:    Date:  04/01/2022   ID:  Kara Olson, DOB 11/11/1955, MRN [id number]  PCP:  Nicoletta Dress, MD  Cardiologist:  Shirlee More, MD    Referring MD: Nicoletta Dress, MD    ASSESSMENT:    1. Coronary artery calcification seen on CAT scan   2. Mild CAD   3. Aortic valve calcification   4. Essential hypertension   5. Mixed hyperlipidemia    PLAN:    In order of problems listed above:  We discussed the coronary artery calcification is fairly much the norm in individuals with known CAD.  I did ask her to start taking aspirin she has hesitance that she would try every other day and continue her lipid-lowering therapy.  We discussed doing a cardiac CTA she is having no anginal discomfort and does not want to do it at this point time and I do not think that is unreasonable. She has aortic valve calcification without stenosis BP is at target well-controlled continue her ARB LDL at target continue combined high intensity statin and Zetia   Next appointment: Plan to see her back in 1 year   Medication Adjustments/Labs and Tests Ordered: Current medicines are reviewed at length with the patient today.  Concerns regarding medicines are outlined above.  No orders of the defined types were placed in this encounter.  No orders of the defined types were placed in this encounter.   Chief Complaint  Patient presents with   abnormal chest CT    History of Present Illness:    Kara Olson is a 66 y.o. female with a hx of aortic valve calcification without aortic stenosis hypertension hyperlipidemia and mild nonobstructive CAD last seen 04/01/2021.  She is followed by pulmonary in [place] for asthma and obstructive sleep apnea.  She is referred here today she had a CT of her chest 02/03/2022 with atherosclerosis and calcification noted in the coronary vessels aorta and great vessels and calcification of the aortic valve.   She had an echocardiogram  performed at [facility] 12/24/2020 she had mild concentric LVH normal left ventricular systolic function EF 55 to 50% grade 1 diastolic dysfunction.  Right ventricle is normal in size and function.  Aortic valve was normal and there is no stenosis.  EKG showed sinus rhythm nonspecific T wave abnormality.  She had a myocardial perfusion study performed at [facility] 12/18/2018 pharmacologically with Lexiscan EF 69% normal perfusion.  Compliance with diet, lifestyle and medications: Yes  This visit was prompted by a CT scan done for lung cancer screening that showed coronary calcification. She has a known history of mild nonobstructive CAD heart catheterization 2007 [facility] or chronic try to access that report. She continues to smoke she is having frequent respiratory infections and likely has chronic bronchitis she brings up multiple tablespoons of purulent sputum every day she also wheezes and she is short of breath when she does activities like housework and gardening work. She is no edema orthopnea and uses CPAP at nighttime. She is not taking aspirin because of bruising blood pressure is at target and her lipids are ideal on combined high intensity statin Zetia most recent lipid profile LDL of 53 cholesterol 160 09/25/2021 Past Medical History:  Diagnosis Date   ADHD    Anginal pain (HCC)    Anxiety and depression    Arthropathy    Asthma    Atherosclerosis    CAD (coronary artery disease)  Diabetes ([place])    Dyspnea 04/09/2014   Sept 29 2015 PFT's  fev1 1.68 (82%) ratio 83 and DLCO 80 and no change p saba    Elevated cholesterol    Essential hypertension 01/07/2016   Gallstones    GERD (gastroesophageal reflux disease)    History of colon polyps    Hyperlipidemia 01/07/2016   Hypertension    IBS (irritable bowel syndrome)    Mild CAD 01/07/2016   OSA (obstructive sleep apnea)    on CPAP   Pneumonia    Restless leg syndrome    Seizures ([place])    Sleep  apnea 01/07/2016   Overview:  With pseudo heart failure   Syncope 10/28/2020   Upper airway cough syndrome 04/09/2014   Followed in Pulmonary clinic/ [facility]/ [facility] - try off acei 04/09/2014       Past Surgical History:  Procedure Laterality Date   ABDOMINAL HYSTERECTOMY     APPENDECTOMY  1980   CARPAL TUNNEL RELEASE     CERVICAL FUSION  2001   CHOLECYSTECTOMY  1997   COLONOSCOPY  01/17/2014   Colon polyp-status post polypectomy. Small internal hemorrhoids. Otherwise normal colonoscopy to the terminal ileum. The colon was somewhat redundant   ESOPHAGOGASTRODUODENOSCOPY  12/21/2012   Normal EGD   TOTAL ABDOMINAL HYSTERECTOMY W/ BILATERAL SALPINGOOPHORECTOMY     TRIGGER FINGER RELEASE     thumb   VESICOVAGINAL FISTULA CLOSURE W/ TAH  1980    Current Medications: Current Meds  Medication Sig   albuterol (PROVENTIL) (2.5 MG/3ML) 0.083% nebulizer solution Take 2.5 mg by nebulization every 6 (six) hours as needed for wheezing or shortness of breath.   albuterol (VENTOLIN HFA) 108 (90 Base) MCG/ACT inhaler Inhale 1 puff into the lungs every 6 (six) hours as needed for wheezing or shortness of breath.   ALPRAZolam (XANAX) 1 MG tablet Take 1 mg by mouth 3 (three) times daily as needed for anxiety.   atorvastatin (LIPITOR) 40 MG tablet Take 40 mg by mouth daily.   budesonide-formoterol (SYMBICORT) 160-4.5 MCG/ACT inhaler Inhale 2 puffs into the lungs daily.   Cholecalciferol (VITAMIN D-3) 5000 UNITS TABS Take 1 tablet by mouth daily.   clobetasol (TEMOVATE) 0.05 % external solution Apply 1 application topically 2 (two) times daily as needed for rash.   Dexlansoprazole 30 MG capsule Take 30 mg by mouth 2 (two) times daily.   escitalopram (LEXAPRO) 10 MG tablet Take 10 mg by mouth daily.   ezetimibe (ZETIA) 10 MG tablet Take 10 mg by mouth daily.   ketoconazole (NIZORAL) 2 % shampoo Apply 1 application topically 3 (three) times a week.   lubiprostone (AMITIZA) 24 MCG capsule Take 24  mcg by mouth 2 (two) times daily with a meal.   metFORMIN (GLUCOPHAGE) 500 MG tablet Take 500 mg by mouth 2 (two) times daily.   metoprolol tartrate (LOPRESSOR) 50 MG tablet Take 50 mg by mouth daily.   mometasone-formoterol (DULERA) 200-5 MCG/ACT AERO Inhale 2 puffs into the lungs 2 (two) times daily.   montelukast (SINGULAIR) 10 MG tablet Take 10 mg by mouth at bedtime.   nitroGLYCERIN (NITROSTAT) 0.4 MG SL tablet Place 0.4 mg under the tongue every 5 (five) minutes as needed for chest pain.   PARoxetine (PAXIL) 40 MG tablet Take 40 mg by mouth daily.   promethazine (PHENERGAN) 25 MG tablet Take 25 mg by mouth every 8 (eight) hours as needed for nausea/vomiting.   Tiotropium Bromide Monohydrate (SPIRIVA RESPIMAT) 1.25 MCG/ACT AERS Inhale 1 puff into  the lungs daily.   valsartan (DIOVAN) 160 MG tablet Take 160 mg by mouth daily.     Allergies:   Nsaids, Sulfamethoxazole, Tape, and Latex   Social History   Socioeconomic History   Marital status: Married    Spouse name: Not on file   Number of children: Not on file   Years of education: Not on file   Highest education level: Not on file  Occupational History   Occupation: Unemployed  Tobacco Use   Smoking status: Every Day    Packs/day: 0.50    Years: 34.00    Total pack years: 17.00    Types: Cigarettes   Smokeless tobacco: Never  Vaping Use   Vaping Use: Never used  Substance and Sexual Activity   Alcohol use: No   Drug use: No   Sexual activity: Not on file  Other Topics Concern   Not on file  Social History Narrative   Not on file   Social Determinants of Health   Financial Resource Strain: Not on file  Food Insecurity: Not on file  Transportation Needs: Not on file  Physical Activity: Not on file  Stress: Not on file  Social Connections: Not on file     Family History: The patient's family history includes Breast cancer in her maternal aunt; Heart disease in her brother and father. There is no history of Colon  cancer or Esophageal cancer. ROS:   Please see the history of present illness.    All other systems reviewed and are negative.  EKGs/Labs/Other Studies Reviewed:    The following studies were reviewed today:  EKG:  EKG ordered today and personally reviewed.  The ekg ordered today demonstrates sinus rhythm tall R in V1 V2 but does not quite fulfill criteria for right bundle branch block.  T wave inversion is seen    Physical Exam:    VS:  BP 110/68 (BP Location: Right Arm, Patient Position: Sitting)   Pulse (!) 55   Ht '5\' 5"'$  (1.651 m)   Wt 203 lb (92.1 kg)   SpO2 92%   BMI 33.78 kg/m     Wt Readings from Last 3 Encounters:  04/01/22 203 lb (92.1 kg)  04/01/21 226 lb 3.2 oz (102.6 kg)  02/16/21 222 lb 3.2 oz (100.8 kg)     GEN:  Well nourished, well developed in no acute distress HEENT: Normal NECK: No JVD; No carotid bruits LYMPHATICS: No lymphadenopathy CARDIAC: Distant heart sounds RRR, no murmurs, rubs, gallops RESPIRATORY:  Clear to auscultation without rales, wheezing or rhonchi  ABDOMEN: Soft, non-tender, non-distended MUSCULOSKELETAL:  No edema; No deformity  SKIN: Warm and dry NEUROLOGIC:  Alert and oriented x 3 PSYCHIATRIC:  Normal affect    Signed, Shirlee More, MD  04/01/2022 10:26 AM    [place]

## 2022-04-01 ENCOUNTER — Ambulatory Visit: Payer: Medicaid Other | Attending: Cardiology | Admitting: Cardiology

## 2022-04-01 ENCOUNTER — Encounter: Payer: Self-pay | Admitting: Cardiology

## 2022-04-01 VITALS — BP 110/68 | HR 55 | Ht 65.0 in | Wt 203.0 lb

## 2022-04-01 DIAGNOSIS — I10 Essential (primary) hypertension: Secondary | ICD-10-CM

## 2022-04-01 DIAGNOSIS — I2510 Atherosclerotic heart disease of native coronary artery without angina pectoris: Secondary | ICD-10-CM

## 2022-04-01 DIAGNOSIS — I359 Nonrheumatic aortic valve disorder, unspecified: Secondary | ICD-10-CM | POA: Diagnosis not present

## 2022-04-01 DIAGNOSIS — E782 Mixed hyperlipidemia: Secondary | ICD-10-CM

## 2022-04-01 MED ORDER — ASPIRIN 81 MG PO TBEC: 81.0000 mg | DELAYED_RELEASE_TABLET | ORAL | 3 refills | Status: AC

## 2022-04-01 NOTE — Patient Instructions (Signed)
Use over the counter Mucinex DM  Buy a flutter on [place]. [email or web address]  Medication Instructions:  Your physician has recommended you make the following change in your medication:   Start 81 mg coated aspirin every other day.  *If you need a refill on your cardiac medications before your next appointment, please call your pharmacy*   Lab Work: None ordered If you have labs (blood work) drawn today and your tests are completely normal, you will receive your results only by: [place] (if you have MyChart) OR A paper copy in the mail If you have any lab test that is abnormal or we need to change your treatment, we will call you to review the results.   Testing/Procedures: None ordered   Follow-Up: At [facility], you and your health needs are our priority.  As part of our continuing mission to provide you with exceptional heart care, we have created designated Provider Care Teams.  These Care Teams include your primary Cardiologist (physician) and Advanced Practice Providers (APPs -  Physician Assistants and Nurse Practitioners) who all work together to provide you with the care you need, when you need it.  We recommend signing up for the patient portal called "MyChart".  Sign up information is provided on this After Visit Summary.  MyChart is used to connect with patients for Virtual Visits (Telemedicine).  Patients are able to view lab/test results, encounter notes, upcoming appointments, etc.  Non-urgent messages can be sent to your provider as well.   To learn more about what you can do with MyChart, go to [email or web address].    Your next appointment:   12 month(s)  The format for your next appointment:   In Person  Provider:   Shirlee More, MD   Other Instructions NA

## 2022-09-13 ENCOUNTER — Encounter: Payer: Self-pay | Admitting: Gastroenterology

## 2022-09-29 DIAGNOSIS — G4733 Obstructive sleep apnea (adult) (pediatric): Secondary | ICD-10-CM | POA: Insufficient documentation

## 2022-09-29 DIAGNOSIS — I2510 Atherosclerotic heart disease of native coronary artery without angina pectoris: Secondary | ICD-10-CM | POA: Insufficient documentation

## 2022-09-29 DIAGNOSIS — E7800 Pure hypercholesterolemia, unspecified: Secondary | ICD-10-CM | POA: Insufficient documentation

## 2022-10-10 ENCOUNTER — Ambulatory Visit: Payer: Medicaid Other | Attending: Cardiology | Admitting: Cardiology

## 2022-10-10 ENCOUNTER — Encounter: Payer: Self-pay | Admitting: Cardiology

## 2022-10-10 VITALS — BP 112/70 | HR 54 | Ht 64.0 in | Wt 195.2 lb

## 2022-10-10 DIAGNOSIS — E782 Mixed hyperlipidemia: Secondary | ICD-10-CM

## 2022-10-10 DIAGNOSIS — I359 Nonrheumatic aortic valve disorder, unspecified: Secondary | ICD-10-CM | POA: Diagnosis not present

## 2022-10-10 DIAGNOSIS — I10 Essential (primary) hypertension: Secondary | ICD-10-CM | POA: Diagnosis not present

## 2022-10-10 DIAGNOSIS — I2510 Atherosclerotic heart disease of native coronary artery without angina pectoris: Secondary | ICD-10-CM | POA: Diagnosis not present

## 2022-10-10 MED ORDER — NITROGLYCERIN 0.4 MG SL SUBL: 0.4000 mg | SUBLINGUAL_TABLET | SUBLINGUAL | 1 refills | Status: AC | PRN

## 2022-10-10 NOTE — Progress Notes (Signed)
Cardiology Office Note:    Date:  10/10/2022   ID:  Kara Olson, DOB 12-12-1955, MRN [id number]  PCP:  Paulina Fusi, MD  Cardiologist:  Norman Herrlich, MD    Referring MD: Paulina Fusi, MD    ASSESSMENT:    1. Mild CAD   2. Aortic valve calcification   3. Essential hypertension   4. Mixed hyperlipidemia    PLAN:    In order of problems listed above:  Recent evaluation of myocardial perfusion study echocardiogram showed normal function no valve stenosis and no demonstrable ischemia.  She is bradycardic I will have her stop her beta-blocker use nitroglycerin as needed and continue aspirin valsartan combined lipid-lowering and atorvastatin and Zetia As bradycardia stop Toprol Blood pressure well-controlled with furosemide valsartan.   Next appointment: 1 year   Medication Adjustments/Labs and Tests Ordered: Current medicines are reviewed at length with the patient today.  Concerns regarding medicines are outlined above.  No orders of the defined types were placed in this encounter.  No orders of the defined types were placed in this encounter.   Chief complaint follow-up after hospitalization and extensive cardiac testing   History of Present Illness:    Kara Olson is a 67 y.o. female with a hx of hypertension hyperlipidemia mild nonobstructive CAD and aortic valve calcification without stenosis last seen 04/02/2019  She was seen in the emergency room [facility] 09/24/2022 for chest pain felt to have unstable angina pectoris.  She underwent myocardial perfusion study showing normal perfusion normal function EF 57% and has not had bradycardia with heart rates in the 40s with reduced beta-blocker dosage. Laboratory studies showed a creatinine 1.43 GFR 41 cc potassium 4.1.  High-sensitivity troponin was assessed mildly elevated but no delta on repeat check.  Echocardiogram showed borderline concentric LVH with possible mild  inferolateral and anterolateral hypokinesia with normal GLS and ejection fraction.  Myocardial perfusion study 09/26/2022: IMPRESSION:  1. No reversible ischemia or infarction.  2. Normal left ventricular wall motion.  3. Left ventricular ejection fraction 57%  4. Non invasive risk stratification*: Low   Compliance with diet, lifestyle and medications: Yes  Her daughter is present participates in evaluation decision making Recent hospitalization was prompted by a great deal of stress and family discord Had no further chest pain has a prescription for nitroglycerin which she could take as needed She is bradycardic and with normal perfusion study I think she can stop her beta-blocker No edema orthopnea shortness of breath palpitation or syncope Past Medical History:  Diagnosis Date   ADHD    Anginal pain (HCC)    Anxiety and depression    Arthropathy    Asthma    Atherosclerosis    CAD (coronary artery disease)    Cigarette smoker 04/09/2014   Diabetes (HCC)    Dyspnea 04/09/2014   Sept 29 2015 PFT's  fev1 1.68 (82%) ratio 83 and DLCO 80 and no change p saba    Elevated cholesterol    Essential hypertension 01/07/2016   Gallstones    GERD (gastroesophageal reflux disease)    History of colon polyps    Hyperlipidemia 01/07/2016   IBS (irritable bowel syndrome)    Mild CAD 01/07/2016   OSA (obstructive sleep apnea)    on CPAP   Pneumonia    Restless leg syndrome    Seizures (HCC)    Sleep apnea 01/07/2016   Overview:  With pseudo heart failure   Syncope 10/28/2020  Upper airway cough syndrome 04/09/2014   Followed in Pulmonary clinic/ [facility]/ [place] - try off acei 04/09/2014       Past Surgical History:  Procedure Laterality Date   ABDOMINAL HYSTERECTOMY     APPENDECTOMY  1980   CARPAL TUNNEL RELEASE     CERVICAL FUSION  2001   CHOLECYSTECTOMY  1997   COLONOSCOPY  01/17/2014   Colon polyp-status post polypectomy. Small internal hemorrhoids. Otherwise  normal colonoscopy to the terminal ileum. The colon was somewhat redundant   ESOPHAGOGASTRODUODENOSCOPY  12/21/2012   Normal EGD   TOTAL ABDOMINAL HYSTERECTOMY W/ BILATERAL SALPINGOOPHORECTOMY     TRIGGER FINGER RELEASE     thumb   VESICOVAGINAL FISTULA CLOSURE W/ TAH  1980    Current Medications: Current Meds  Medication Sig   albuterol (PROVENTIL) (2.5 MG/3ML) 0.083% nebulizer solution Take 2.5 mg by nebulization every 6 (six) hours as needed for wheezing or shortness of breath.   albuterol (VENTOLIN HFA) 108 (90 Base) MCG/ACT inhaler Inhale 1 puff into the lungs every 6 (six) hours as needed for wheezing or shortness of breath.   ALPRAZolam (XANAX) 1 MG tablet Take 1 mg by mouth 3 (three) times daily as needed for anxiety.   aspirin EC 81 MG tablet Take 1 tablet (81 mg total) by mouth every other day. Swallow whole.   atorvastatin (LIPITOR) 40 MG tablet Take 40 mg by mouth daily.   budesonide-formoterol (SYMBICORT) 160-4.5 MCG/ACT inhaler Inhale 2 puffs into the lungs daily.   Dexlansoprazole 30 MG capsule Take 30 mg by mouth 2 (two) times daily.   escitalopram (LEXAPRO) 10 MG tablet Take 10 mg by mouth daily.   ezetimibe (ZETIA) 10 MG tablet Take 10 mg by mouth daily.   furosemide (LASIX) 40 MG tablet Take 40 mg by mouth 2 (two) times daily.   JARDIANCE 25 MG TABS tablet Take 25 mg by mouth daily.   ketoconazole (NIZORAL) 2 % shampoo Apply 1 application topically 3 (three) times a week.   LINZESS 290 MCG CAPS capsule Take 290 mcg by mouth daily.   metFORMIN (GLUCOPHAGE) 500 MG tablet Take 500 mg by mouth 2 (two) times daily.   mometasone-formoterol (DULERA) 200-5 MCG/ACT AERO Inhale 2 puffs into the lungs 2 (two) times daily.   nitroGLYCERIN (NITROSTAT) 0.4 MG SL tablet Place 0.4 mg under the tongue every 5 (five) minutes as needed for chest pain.   PARoxetine (PAXIL) 40 MG tablet Take 40 mg by mouth daily.   promethazine (PHENERGAN) 25 MG tablet Take 25 mg by mouth every 8 (eight)  hours as needed for nausea/vomiting.   Tiotropium Bromide Monohydrate (SPIRIVA RESPIMAT) 1.25 MCG/ACT AERS Inhale 1 puff into the lungs daily.   TRADJENTA 5 MG TABS tablet Take 5 mg by mouth daily.   valsartan (DIOVAN) 320 MG tablet Take 320 mg by mouth daily.   [DISCONTINUED] metoprolol tartrate (LOPRESSOR) 25 MG tablet Take 25 mg by mouth 2 (two) times daily.   [DISCONTINUED] valsartan (DIOVAN) 160 MG tablet Take 160 mg by mouth daily.     Allergies:   Nsaids, Sulfamethoxazole, Tape, and Latex   Social History   Socioeconomic History   Marital status: Married    Spouse name: Not on file   Number of children: Not on file   Years of education: Not on file   Highest education level: Not on file  Occupational History   Occupation: Unemployed  Tobacco Use   Smoking status: Every Day    Packs/day: 0.50  Years: 34.00    Additional pack years: 0.00    Total pack years: 17.00    Types: Cigarettes   Smokeless tobacco: Never  Vaping Use   Vaping Use: Never used  Substance and Sexual Activity   Alcohol use: No   Drug use: No   Sexual activity: Not on file  Other Topics Concern   Not on file  Social History Narrative   Not on file   Social Determinants of Health   Financial Resource Strain: Not on file  Food Insecurity: Not on file  Transportation Needs: Not on file  Physical Activity: Not on file  Stress: Not on file  Social Connections: Not on file     Family History: The patient's family history includes Breast cancer in her maternal aunt; Heart disease in her brother and father. There is no history of Colon cancer or Esophageal cancer. ROS:   Please see the history of present illness.    All other systems reviewed and are negative.  EKGs/Labs/Other Studies Reviewed:    The following studies were reviewed today:   Severe 203 2024 cholesterol 164 LDL 65 triglyceride 433 HDL 32  Physical Exam:    VS:  BP 112/70   Pulse (!) 54   Ht 5\' 4"  (1.626 m)   Wt 195 lb  3.2 oz (88.5 kg)   SpO2 96%   BMI 33.51 kg/m     Wt Readings from Last 3 Encounters:  10/10/22 195 lb 3.2 oz (88.5 kg)  04/01/22 203 lb (92.1 kg)  04/01/21 226 lb 3.2 oz (102.6 kg)     GEN:  Well nourished, well developed in no acute distress HEENT: Normal NECK: No JVD; No carotid bruits LYMPHATICS: No lymphadenopathy CARDIAC: RRR, no murmurs, rubs, gallops RESPIRATORY:  Clear to auscultation without rales, wheezing or rhonchi  ABDOMEN: Soft, non-tender, non-distended MUSCULOSKELETAL:  No edema; No deformity  SKIN: Warm and dry NEUROLOGIC:  Alert and oriented x 3 PSYCHIATRIC:  Normal affect    Signed, Norman Herrlich, MD  10/10/2022 11:13 AM    [facility]

## 2022-10-10 NOTE — Patient Instructions (Signed)
Medication Instructions:  Your physician has recommended you make the following change in your medication:   STOP: Metoprolol  *If you need a refill on your cardiac medications before your next appointment, please call your pharmacy*   Lab Work: None If you have labs (blood work) drawn today and your tests are completely normal, you will receive your results only by: MyChart Message (if you have MyChart) OR A paper copy in the mail If you have any lab test that is abnormal or we need to change your treatment, we will call you to review the results.   Testing/Procedures: None   Follow-Up: At [facility], you and your health needs are our priority.  As part of our continuing mission to provide you with exceptional heart care, we have created designated Provider Care Teams.  These Care Teams include your primary Cardiologist (physician) and Advanced Practice Providers (APPs -  Physician Assistants and Nurse Practitioners) who all work together to provide you with the care you need, when you need it.  We recommend signing up for the patient portal called "MyChart".  Sign up information is provided on this After Visit Summary.  MyChart is used to connect with patients for Virtual Visits (Telemedicine).  Patients are able to view lab/test results, encounter notes, upcoming appointments, etc.  Non-urgent messages can be sent to your provider as well.   To learn more about what you can do with MyChart, go to [email or web address].    Your next appointment:   1 year(s)  Provider:   Norman Herrlich, MD    Other Instructions None

## 2022-11-26 DIAGNOSIS — I10 Essential (primary) hypertension: Secondary | ICD-10-CM | POA: Diagnosis not present

## 2022-11-26 DIAGNOSIS — J449 Chronic obstructive pulmonary disease, unspecified: Secondary | ICD-10-CM | POA: Diagnosis not present

## 2022-11-26 DIAGNOSIS — E1169 Type 2 diabetes mellitus with other specified complication: Secondary | ICD-10-CM | POA: Diagnosis not present

## 2022-11-26 DIAGNOSIS — I2510 Atherosclerotic heart disease of native coronary artery without angina pectoris: Secondary | ICD-10-CM | POA: Diagnosis not present

## 2022-11-26 DIAGNOSIS — K219 Gastro-esophageal reflux disease without esophagitis: Secondary | ICD-10-CM | POA: Diagnosis not present

## 2022-11-26 DIAGNOSIS — Z86010 Personal history of colonic polyps: Secondary | ICD-10-CM | POA: Diagnosis not present

## 2022-11-26 DIAGNOSIS — F418 Other specified anxiety disorders: Secondary | ICD-10-CM | POA: Diagnosis not present

## 2022-11-26 DIAGNOSIS — G8929 Other chronic pain: Secondary | ICD-10-CM | POA: Diagnosis not present

## 2022-11-26 DIAGNOSIS — E785 Hyperlipidemia, unspecified: Secondary | ICD-10-CM | POA: Diagnosis not present

## 2022-11-30 DIAGNOSIS — Z1231 Encounter for screening mammogram for malignant neoplasm of breast: Secondary | ICD-10-CM | POA: Diagnosis not present

## 2022-12-01 DIAGNOSIS — M4316 Spondylolisthesis, lumbar region: Secondary | ICD-10-CM | POA: Diagnosis not present

## 2022-12-01 DIAGNOSIS — G5701 Lesion of sciatic nerve, right lower limb: Secondary | ICD-10-CM | POA: Diagnosis not present

## 2022-12-01 DIAGNOSIS — M1611 Unilateral primary osteoarthritis, right hip: Secondary | ICD-10-CM | POA: Diagnosis not present

## 2022-12-01 DIAGNOSIS — S82891A Other fracture of right lower leg, initial encounter for closed fracture: Secondary | ICD-10-CM | POA: Diagnosis not present

## 2022-12-05 DIAGNOSIS — M1611 Unilateral primary osteoarthritis, right hip: Secondary | ICD-10-CM | POA: Diagnosis not present

## 2022-12-05 DIAGNOSIS — M25551 Pain in right hip: Secondary | ICD-10-CM | POA: Diagnosis not present

## 2022-12-07 DIAGNOSIS — L821 Other seborrheic keratosis: Secondary | ICD-10-CM | POA: Diagnosis not present

## 2022-12-07 DIAGNOSIS — L304 Erythema intertrigo: Secondary | ICD-10-CM | POA: Diagnosis not present

## 2023-01-03 DIAGNOSIS — S82891A Other fracture of right lower leg, initial encounter for closed fracture: Secondary | ICD-10-CM | POA: Diagnosis not present

## 2023-01-24 DIAGNOSIS — L304 Erythema intertrigo: Secondary | ICD-10-CM | POA: Diagnosis not present

## 2023-02-01 DIAGNOSIS — S82891A Other fracture of right lower leg, initial encounter for closed fracture: Secondary | ICD-10-CM | POA: Diagnosis not present

## 2023-02-17 DIAGNOSIS — J4541 Moderate persistent asthma with (acute) exacerbation: Secondary | ICD-10-CM | POA: Diagnosis not present

## 2023-02-17 DIAGNOSIS — G4733 Obstructive sleep apnea (adult) (pediatric): Secondary | ICD-10-CM | POA: Diagnosis not present

## 2023-02-17 DIAGNOSIS — F17210 Nicotine dependence, cigarettes, uncomplicated: Secondary | ICD-10-CM | POA: Diagnosis not present

## 2023-02-17 DIAGNOSIS — E559 Vitamin D deficiency, unspecified: Secondary | ICD-10-CM | POA: Diagnosis not present

## 2023-03-11 DIAGNOSIS — I10 Essential (primary) hypertension: Secondary | ICD-10-CM | POA: Diagnosis not present

## 2023-03-11 DIAGNOSIS — K219 Gastro-esophageal reflux disease without esophagitis: Secondary | ICD-10-CM | POA: Diagnosis not present

## 2023-03-11 DIAGNOSIS — Z9181 History of falling: Secondary | ICD-10-CM | POA: Diagnosis not present

## 2023-03-11 DIAGNOSIS — Z6831 Body mass index (BMI) 31.0-31.9, adult: Secondary | ICD-10-CM | POA: Diagnosis not present

## 2023-03-11 DIAGNOSIS — I2510 Atherosclerotic heart disease of native coronary artery without angina pectoris: Secondary | ICD-10-CM | POA: Diagnosis not present

## 2023-03-11 DIAGNOSIS — E785 Hyperlipidemia, unspecified: Secondary | ICD-10-CM | POA: Diagnosis not present

## 2023-03-11 DIAGNOSIS — E1169 Type 2 diabetes mellitus with other specified complication: Secondary | ICD-10-CM | POA: Diagnosis not present

## 2023-03-17 DIAGNOSIS — G4733 Obstructive sleep apnea (adult) (pediatric): Secondary | ICD-10-CM | POA: Diagnosis not present

## 2023-03-17 DIAGNOSIS — J4541 Moderate persistent asthma with (acute) exacerbation: Secondary | ICD-10-CM | POA: Diagnosis not present

## 2023-03-17 DIAGNOSIS — F17210 Nicotine dependence, cigarettes, uncomplicated: Secondary | ICD-10-CM | POA: Diagnosis not present

## 2023-03-24 DIAGNOSIS — R42 Dizziness and giddiness: Secondary | ICD-10-CM | POA: Diagnosis not present

## 2023-03-24 DIAGNOSIS — N184 Chronic kidney disease, stage 4 (severe): Secondary | ICD-10-CM | POA: Diagnosis not present

## 2023-03-24 DIAGNOSIS — F0393 Unspecified dementia, unspecified severity, with mood disturbance: Secondary | ICD-10-CM | POA: Diagnosis not present

## 2023-03-24 DIAGNOSIS — R251 Tremor, unspecified: Secondary | ICD-10-CM | POA: Diagnosis not present

## 2023-03-24 DIAGNOSIS — F410 Panic disorder [episodic paroxysmal anxiety] without agoraphobia: Secondary | ICD-10-CM | POA: Diagnosis not present

## 2023-03-24 DIAGNOSIS — F418 Other specified anxiety disorders: Secondary | ICD-10-CM | POA: Diagnosis not present

## 2023-03-24 DIAGNOSIS — R531 Weakness: Secondary | ICD-10-CM | POA: Diagnosis not present

## 2023-03-31 DIAGNOSIS — I6782 Cerebral ischemia: Secondary | ICD-10-CM | POA: Diagnosis not present

## 2023-03-31 DIAGNOSIS — G319 Degenerative disease of nervous system, unspecified: Secondary | ICD-10-CM | POA: Diagnosis not present

## 2023-03-31 DIAGNOSIS — F0393 Unspecified dementia, unspecified severity, with mood disturbance: Secondary | ICD-10-CM | POA: Diagnosis not present

## 2023-03-31 DIAGNOSIS — F0280 Dementia in other diseases classified elsewhere without behavioral disturbance: Secondary | ICD-10-CM | POA: Diagnosis not present

## 2023-04-05 DIAGNOSIS — G5701 Lesion of sciatic nerve, right lower limb: Secondary | ICD-10-CM | POA: Diagnosis not present

## 2023-04-05 DIAGNOSIS — M4316 Spondylolisthesis, lumbar region: Secondary | ICD-10-CM | POA: Diagnosis not present

## 2023-04-12 DIAGNOSIS — M4316 Spondylolisthesis, lumbar region: Secondary | ICD-10-CM | POA: Diagnosis not present

## 2023-04-12 DIAGNOSIS — M47817 Spondylosis without myelopathy or radiculopathy, lumbosacral region: Secondary | ICD-10-CM | POA: Diagnosis not present

## 2023-04-12 DIAGNOSIS — M47816 Spondylosis without myelopathy or radiculopathy, lumbar region: Secondary | ICD-10-CM | POA: Diagnosis not present

## 2023-04-12 DIAGNOSIS — M48061 Spinal stenosis, lumbar region without neurogenic claudication: Secondary | ICD-10-CM | POA: Diagnosis not present

## 2023-04-21 DIAGNOSIS — M4316 Spondylolisthesis, lumbar region: Secondary | ICD-10-CM | POA: Diagnosis not present

## 2023-04-24 DIAGNOSIS — F0393 Unspecified dementia, unspecified severity, with mood disturbance: Secondary | ICD-10-CM | POA: Diagnosis not present

## 2023-04-24 DIAGNOSIS — F418 Other specified anxiety disorders: Secondary | ICD-10-CM | POA: Diagnosis not present

## 2023-04-24 DIAGNOSIS — R251 Tremor, unspecified: Secondary | ICD-10-CM | POA: Diagnosis not present

## 2023-05-22 DIAGNOSIS — J4541 Moderate persistent asthma with (acute) exacerbation: Secondary | ICD-10-CM | POA: Diagnosis not present

## 2023-05-22 DIAGNOSIS — J479 Bronchiectasis, uncomplicated: Secondary | ICD-10-CM | POA: Diagnosis not present

## 2023-05-22 DIAGNOSIS — J449 Chronic obstructive pulmonary disease, unspecified: Secondary | ICD-10-CM | POA: Diagnosis not present

## 2023-05-22 DIAGNOSIS — F17210 Nicotine dependence, cigarettes, uncomplicated: Secondary | ICD-10-CM | POA: Diagnosis not present

## 2023-05-22 DIAGNOSIS — G4733 Obstructive sleep apnea (adult) (pediatric): Secondary | ICD-10-CM | POA: Diagnosis not present

## 2023-06-21 DIAGNOSIS — E119 Type 2 diabetes mellitus without complications: Secondary | ICD-10-CM | POA: Diagnosis not present

## 2023-06-26 DIAGNOSIS — K219 Gastro-esophageal reflux disease without esophagitis: Secondary | ICD-10-CM | POA: Diagnosis not present

## 2023-06-26 DIAGNOSIS — E559 Vitamin D deficiency, unspecified: Secondary | ICD-10-CM | POA: Insufficient documentation

## 2023-06-26 DIAGNOSIS — D6489 Other specified anemias: Secondary | ICD-10-CM | POA: Diagnosis not present

## 2023-06-26 DIAGNOSIS — E1169 Type 2 diabetes mellitus with other specified complication: Secondary | ICD-10-CM | POA: Diagnosis not present

## 2023-06-26 DIAGNOSIS — R809 Proteinuria, unspecified: Secondary | ICD-10-CM | POA: Diagnosis not present

## 2023-06-26 DIAGNOSIS — N183 Chronic kidney disease, stage 3 unspecified: Secondary | ICD-10-CM | POA: Insufficient documentation

## 2023-06-26 DIAGNOSIS — D520 Dietary folate deficiency anemia: Secondary | ICD-10-CM | POA: Diagnosis not present

## 2023-06-26 DIAGNOSIS — I10 Essential (primary) hypertension: Secondary | ICD-10-CM | POA: Diagnosis not present

## 2023-06-26 DIAGNOSIS — E211 Secondary hyperparathyroidism, not elsewhere classified: Secondary | ICD-10-CM | POA: Diagnosis not present

## 2023-06-26 DIAGNOSIS — E7849 Other hyperlipidemia: Secondary | ICD-10-CM | POA: Diagnosis not present

## 2023-06-26 HISTORY — DX: Chronic kidney disease, stage 3 unspecified: N18.30

## 2023-06-26 HISTORY — DX: Vitamin D deficiency, unspecified: E55.9

## 2023-06-28 DIAGNOSIS — I2510 Atherosclerotic heart disease of native coronary artery without angina pectoris: Secondary | ICD-10-CM | POA: Diagnosis not present

## 2023-06-28 DIAGNOSIS — N184 Chronic kidney disease, stage 4 (severe): Secondary | ICD-10-CM | POA: Diagnosis not present

## 2023-06-28 DIAGNOSIS — Z23 Encounter for immunization: Secondary | ICD-10-CM | POA: Diagnosis not present

## 2023-06-28 DIAGNOSIS — E785 Hyperlipidemia, unspecified: Secondary | ICD-10-CM | POA: Diagnosis not present

## 2023-06-28 DIAGNOSIS — F418 Other specified anxiety disorders: Secondary | ICD-10-CM | POA: Diagnosis not present

## 2023-06-28 DIAGNOSIS — F0393 Unspecified dementia, unspecified severity, with mood disturbance: Secondary | ICD-10-CM | POA: Diagnosis not present

## 2023-06-28 DIAGNOSIS — K219 Gastro-esophageal reflux disease without esophagitis: Secondary | ICD-10-CM | POA: Diagnosis not present

## 2023-06-28 DIAGNOSIS — R251 Tremor, unspecified: Secondary | ICD-10-CM | POA: Diagnosis not present

## 2023-06-28 DIAGNOSIS — E1169 Type 2 diabetes mellitus with other specified complication: Secondary | ICD-10-CM | POA: Diagnosis not present

## 2023-06-28 DIAGNOSIS — I10 Essential (primary) hypertension: Secondary | ICD-10-CM | POA: Diagnosis not present

## 2023-07-10 ENCOUNTER — Other Ambulatory Visit: Payer: Self-pay | Admitting: Physician Assistant

## 2023-07-10 DIAGNOSIS — M4316 Spondylolisthesis, lumbar region: Secondary | ICD-10-CM

## 2023-07-17 DIAGNOSIS — N189 Chronic kidney disease, unspecified: Secondary | ICD-10-CM | POA: Diagnosis not present

## 2023-07-17 DIAGNOSIS — N281 Cyst of kidney, acquired: Secondary | ICD-10-CM | POA: Diagnosis not present

## 2023-07-24 ENCOUNTER — Ambulatory Visit: Payer: Medicare HMO | Admitting: Neurology

## 2023-07-24 DIAGNOSIS — R6889 Other general symptoms and signs: Secondary | ICD-10-CM | POA: Diagnosis not present

## 2023-07-24 DIAGNOSIS — Z20828 Contact with and (suspected) exposure to other viral communicable diseases: Secondary | ICD-10-CM | POA: Diagnosis not present

## 2023-07-24 DIAGNOSIS — Z20822 Contact with and (suspected) exposure to covid-19: Secondary | ICD-10-CM | POA: Diagnosis not present

## 2023-07-24 DIAGNOSIS — R8290 Unspecified abnormal findings in urine: Secondary | ICD-10-CM | POA: Diagnosis not present

## 2023-07-24 DIAGNOSIS — R1013 Epigastric pain: Secondary | ICD-10-CM | POA: Diagnosis not present

## 2023-07-24 DIAGNOSIS — K219 Gastro-esophageal reflux disease without esophagitis: Secondary | ICD-10-CM | POA: Diagnosis not present

## 2023-07-24 NOTE — Discharge Instructions (Signed)

## 2023-07-25 ENCOUNTER — Inpatient Hospital Stay
Admission: RE | Admit: 2023-07-25 | Discharge: 2023-07-25 | Disposition: A | Discharge status: Home / Self Care | Payer: Medicare HMO | Source: Ambulatory Visit | Attending: Physician Assistant | Admitting: Physician Assistant

## 2023-09-13 DIAGNOSIS — M25512 Pain in left shoulder: Secondary | ICD-10-CM | POA: Diagnosis not present

## 2023-09-22 DIAGNOSIS — N183 Chronic kidney disease, stage 3 unspecified: Secondary | ICD-10-CM | POA: Diagnosis not present

## 2023-09-22 DIAGNOSIS — E559 Vitamin D deficiency, unspecified: Secondary | ICD-10-CM | POA: Diagnosis not present

## 2023-09-22 DIAGNOSIS — R300 Dysuria: Secondary | ICD-10-CM | POA: Diagnosis not present

## 2023-09-22 DIAGNOSIS — K219 Gastro-esophageal reflux disease without esophagitis: Secondary | ICD-10-CM | POA: Diagnosis not present

## 2023-09-22 DIAGNOSIS — R809 Proteinuria, unspecified: Secondary | ICD-10-CM | POA: Diagnosis not present

## 2023-09-22 DIAGNOSIS — E1169 Type 2 diabetes mellitus with other specified complication: Secondary | ICD-10-CM | POA: Diagnosis not present

## 2023-09-22 DIAGNOSIS — I10 Essential (primary) hypertension: Secondary | ICD-10-CM | POA: Diagnosis not present

## 2023-09-22 DIAGNOSIS — E785 Hyperlipidemia, unspecified: Secondary | ICD-10-CM | POA: Diagnosis not present

## 2023-09-28 DIAGNOSIS — M25512 Pain in left shoulder: Secondary | ICD-10-CM | POA: Diagnosis not present

## 2023-09-29 DIAGNOSIS — M75112 Incomplete rotator cuff tear or rupture of left shoulder, not specified as traumatic: Secondary | ICD-10-CM | POA: Diagnosis not present

## 2023-10-02 DIAGNOSIS — S42152A Displaced fracture of neck of scapula, left shoulder, initial encounter for closed fracture: Secondary | ICD-10-CM | POA: Diagnosis not present

## 2023-10-02 DIAGNOSIS — S43015D Anterior dislocation of left humerus, subsequent encounter: Secondary | ICD-10-CM | POA: Diagnosis not present

## 2023-10-02 DIAGNOSIS — S42142A Displaced fracture of glenoid cavity of scapula, left shoulder, initial encounter for closed fracture: Secondary | ICD-10-CM | POA: Diagnosis not present

## 2023-10-02 DIAGNOSIS — S46012A Strain of muscle(s) and tendon(s) of the rotator cuff of left shoulder, initial encounter: Secondary | ICD-10-CM | POA: Diagnosis not present

## 2023-10-03 DIAGNOSIS — F17210 Nicotine dependence, cigarettes, uncomplicated: Secondary | ICD-10-CM | POA: Diagnosis not present

## 2023-10-03 DIAGNOSIS — G4733 Obstructive sleep apnea (adult) (pediatric): Secondary | ICD-10-CM | POA: Diagnosis not present

## 2023-10-03 DIAGNOSIS — J4541 Moderate persistent asthma with (acute) exacerbation: Secondary | ICD-10-CM | POA: Diagnosis not present

## 2023-10-03 DIAGNOSIS — S42152A Displaced fracture of neck of scapula, left shoulder, initial encounter for closed fracture: Secondary | ICD-10-CM | POA: Diagnosis not present

## 2023-10-03 DIAGNOSIS — S42142A Displaced fracture of glenoid cavity of scapula, left shoulder, initial encounter for closed fracture: Secondary | ICD-10-CM | POA: Diagnosis not present

## 2023-10-06 DIAGNOSIS — R251 Tremor, unspecified: Secondary | ICD-10-CM | POA: Diagnosis not present

## 2023-10-06 DIAGNOSIS — F0393 Unspecified dementia, unspecified severity, with mood disturbance: Secondary | ICD-10-CM | POA: Diagnosis not present

## 2023-10-06 DIAGNOSIS — I10 Essential (primary) hypertension: Secondary | ICD-10-CM | POA: Diagnosis not present

## 2023-10-06 DIAGNOSIS — K219 Gastro-esophageal reflux disease without esophagitis: Secondary | ICD-10-CM | POA: Diagnosis not present

## 2023-10-06 DIAGNOSIS — E1169 Type 2 diabetes mellitus with other specified complication: Secondary | ICD-10-CM | POA: Diagnosis not present

## 2023-10-06 DIAGNOSIS — N184 Chronic kidney disease, stage 4 (severe): Secondary | ICD-10-CM | POA: Diagnosis not present

## 2023-10-06 DIAGNOSIS — E785 Hyperlipidemia, unspecified: Secondary | ICD-10-CM | POA: Diagnosis not present

## 2023-10-06 DIAGNOSIS — F418 Other specified anxiety disorders: Secondary | ICD-10-CM | POA: Diagnosis not present

## 2023-10-06 DIAGNOSIS — I2510 Atherosclerotic heart disease of native coronary artery without angina pectoris: Secondary | ICD-10-CM | POA: Diagnosis not present

## 2023-10-09 DIAGNOSIS — Z122 Encounter for screening for malignant neoplasm of respiratory organs: Secondary | ICD-10-CM | POA: Diagnosis not present

## 2023-10-09 DIAGNOSIS — R918 Other nonspecific abnormal finding of lung field: Secondary | ICD-10-CM | POA: Diagnosis not present

## 2023-10-09 DIAGNOSIS — I2510 Atherosclerotic heart disease of native coronary artery without angina pectoris: Secondary | ICD-10-CM | POA: Diagnosis not present

## 2023-10-09 DIAGNOSIS — F17210 Nicotine dependence, cigarettes, uncomplicated: Secondary | ICD-10-CM | POA: Diagnosis not present

## 2023-11-02 DIAGNOSIS — G4733 Obstructive sleep apnea (adult) (pediatric): Secondary | ICD-10-CM | POA: Diagnosis not present

## 2023-11-02 DIAGNOSIS — R918 Other nonspecific abnormal finding of lung field: Secondary | ICD-10-CM | POA: Diagnosis not present

## 2023-11-02 DIAGNOSIS — F17210 Nicotine dependence, cigarettes, uncomplicated: Secondary | ICD-10-CM | POA: Diagnosis not present

## 2023-11-02 DIAGNOSIS — J4541 Moderate persistent asthma with (acute) exacerbation: Secondary | ICD-10-CM | POA: Diagnosis not present

## 2023-11-14 DIAGNOSIS — G4733 Obstructive sleep apnea (adult) (pediatric): Secondary | ICD-10-CM | POA: Diagnosis not present

## 2023-11-17 DIAGNOSIS — S43015D Anterior dislocation of left humerus, subsequent encounter: Secondary | ICD-10-CM | POA: Diagnosis not present

## 2023-11-28 ENCOUNTER — Ambulatory Visit: Admitting: Cardiology

## 2023-11-30 ENCOUNTER — Ambulatory Visit: Admitting: Neurology

## 2024-01-11 DIAGNOSIS — F418 Other specified anxiety disorders: Secondary | ICD-10-CM | POA: Diagnosis not present

## 2024-01-11 DIAGNOSIS — E1169 Type 2 diabetes mellitus with other specified complication: Secondary | ICD-10-CM | POA: Diagnosis not present

## 2024-01-11 DIAGNOSIS — K219 Gastro-esophageal reflux disease without esophagitis: Secondary | ICD-10-CM | POA: Diagnosis not present

## 2024-01-11 DIAGNOSIS — I10 Essential (primary) hypertension: Secondary | ICD-10-CM | POA: Diagnosis not present

## 2024-01-11 DIAGNOSIS — N184 Chronic kidney disease, stage 4 (severe): Secondary | ICD-10-CM | POA: Diagnosis not present

## 2024-01-11 DIAGNOSIS — F0393 Unspecified dementia, unspecified severity, with mood disturbance: Secondary | ICD-10-CM | POA: Diagnosis not present

## 2024-01-11 DIAGNOSIS — E785 Hyperlipidemia, unspecified: Secondary | ICD-10-CM | POA: Diagnosis not present

## 2024-01-11 DIAGNOSIS — R251 Tremor, unspecified: Secondary | ICD-10-CM | POA: Diagnosis not present

## 2024-01-11 DIAGNOSIS — I2510 Atherosclerotic heart disease of native coronary artery without angina pectoris: Secondary | ICD-10-CM | POA: Diagnosis not present

## 2024-01-15 ENCOUNTER — Ambulatory Visit: Admitting: Cardiology

## 2024-01-16 DIAGNOSIS — Z1231 Encounter for screening mammogram for malignant neoplasm of breast: Secondary | ICD-10-CM | POA: Diagnosis not present

## 2024-01-17 DIAGNOSIS — Z1231 Encounter for screening mammogram for malignant neoplasm of breast: Secondary | ICD-10-CM | POA: Diagnosis not present

## 2024-01-17 NOTE — Progress Notes (Unsigned)
 Cardiology Office Note:    Date:  01/19/2024   ID:  Kara Olson, DOB [birth year], MRN [id number]  PCP:  Keren Vicenta BRAVO, MD  Cardiologist:  Redell Leiter, MD    Referring MD: Keren Vicenta BRAVO, MD    ASSESSMENT:    1. Mild CAD   2. Aortic valve calcification   3. Hypertensive heart disease without heart failure   4. Mixed hyperlipidemia   5. Stage 3b chronic kidney disease (HCC)   6. Bradycardia    PLAN:    In order of problems listed above:  Coronary angiography 2019 showed mild nonobstructive CAD she is on good medical therapy including aspirin  lipid-lowering with a combination of high intensity statin and Zetia along with antihypertensives has no anginal discomfort at this time I do not think she requires a repeat ischemia evaluation No murmur on exam no evidence of aortic stenosis Well-controlled especially with her weight loss continue her medical regimen including diuretic and ARB Lipids are at target continue her current statin Improved kidney function Stable not on beta-blocker   Next appointment: I will plan to see her in 1 year or sooner if she has cardiovascular symptoms   Medication Adjustments/Labs and Tests Ordered: Current medicines are reviewed at length with the patient today.  Concerns regarding medicines are outlined above.  Orders Placed This Encounter  Procedures   EKG 12-Lead   No orders of the defined types were placed in this encounter.    History of Present Illness:    Kara Olson is a 67 y.o. female with a hx of mild nonobstructive CAD aortic valve calcification without stenosis hypertensive heart disease with LVH stage IIIb CKD hyperlipidemia and bradycardia with a beta-blocker last seen on 10/10/2022.  Compliance with diet, lifestyle and medications: Yes  She remains quite active her and her husband are raising two 8-year-old girls and is not having any cardiovascular symptoms edema shortness of breath chest pain palpitation or  syncope She tolerates her lipid-lowering therapy without muscle pain or weakness Past Medical History:  Diagnosis Date   Angina pectoris (HCC) 03/29/2021   Anginal pain (HCC)    Anxiety and depression    Arthropathy    Asthma    Atherosclerosis    Attention deficit hyperactivity disorder 03/29/2021   CAD (coronary artery disease)    Chronic kidney disease, stage 3 unspecified (HCC) 06/26/2023   Cigarette smoker 04/09/2014   Diabetes mellitus (HCC) 03/29/2021   Dyspnea 04/09/2014   Sept 29 2015 PFT's  fev1 1.68 (82%) ratio 83 and DLCO 80 and no change p saba    Elevated cholesterol    Essential hypertension 01/07/2016   Gallstone 03/29/2021   Gastroesophageal reflux disease 03/29/2021   History of colon polyps    Hyperlipidemia 01/07/2016   Irritable bowel syndrome 03/29/2021   Mild CAD 01/07/2016   Mixed anxiety and depressive disorder 03/29/2021   Obstructive sleep apnea syndrome 03/29/2021   on CPAP     OSA (obstructive sleep apnea)    on CPAP   Personal history of colon polyps, unspecified 03/29/2021   Pneumonia    Posterior rhinorrhea 04/09/2014   Followed in Pulmonary clinic/ [facility]/ [place]  - try off acei 04/09/2014      Followed in Pulmonary clinic/ [facility]/ [place] - try off acei 04/09/2014     Restless leg syndrome    Seizure (HCC) 03/29/2021   Sleep apnea 01/07/2016   Overview:  With pseudo heart failure   Syncope 10/28/2020   Upper  airway cough syndrome 04/09/2014   Followed in Pulmonary clinic/ [facility]/ [place] - try off acei 04/09/2014      Vitamin D deficiency 06/26/2023    Current Medications: Current Meds  Medication Sig   albuterol  (VENTOLIN  HFA) 108 (90 Base) MCG/ACT inhaler Inhale 1 puff into the lungs every 6 (six) hours as needed for wheezing or shortness of breath.   aspirin  EC 81 MG tablet Take 1 tablet (81 mg total) by mouth every other day. Swallow whole.   atorvastatin (LIPITOR) 40 MG tablet Take 40 mg by mouth  daily.   budesonide-glycopyrrolate-formoterol (BREZTRI AEROSPHERE) 160-9-4.8 MCG/ACT AERO inhaler Inhale 2 puffs into the lungs every morning.   busPIRone (BUSPAR) 15 MG tablet Take 15 mg by mouth 2 (two) times daily.   ezetimibe (ZETIA) 10 MG tablet Take 10 mg by mouth daily.   famotidine (PEPCID) 40 MG tablet Take 40 mg by mouth daily.   furosemide (LASIX) 40 MG tablet Take 40 mg by mouth daily.   ipratropium-albuterol  (DUONEB) 0.5-2.5 (3) MG/3ML SOLN Take 3 mLs by nebulization 4 (four) times daily as needed (wjeezing or shortness of breath).   LINZESS 290 MCG CAPS capsule Take 290 mcg by mouth daily.   nitroGLYCERIN  (NITROSTAT ) 0.4 MG SL tablet Place 1 tablet (0.4 mg total) under the tongue every 5 (five) minutes as needed for chest pain.   TRADJENTA 5 MG TABS tablet Take 5 mg by mouth daily.   valsartan  (DIOVAN ) 320 MG tablet Take 320 mg by mouth daily.   venlafaxine XR (EFFEXOR-XR) 75 MG 24 hr capsule Take 75 mg by mouth daily.   Vitamin D, Ergocalciferol, (DRISDOL) 1.25 MG (50000 UNIT) CAPS capsule Take 50,000 Units by mouth every 7 (seven) days.      EKGs/Labs/Other Studies Reviewed:    The following studies were reviewed today:      EKG Interpretation Date/Time:  Friday January 19 2024 09:54:51 EDT Ventricular Rate:  72 PR Interval:    QRS Duration:  82 QT Interval:  428 QTC Calculation: 468 R Axis:   59  Text Interpretation: Sinus rhythm Abnormal ECG No previous ECGs available Confirmed by Monetta Rogue ([id number]) on 01/19/2024 10:21:20 AMEKG Interpretation Date/Time:  Friday January 19 2024 09:54:51 EDT Ventricular Rate:  72 PR Interval:    QRS Duration:  82 QT Interval:  428 QTC Calculation: 468 R Axis:   59  Text Interpretation: Sinus rhythm Abnormal ECG No previous ECGs available Confirmed by Monetta Rogue ([id number]) on 01/19/2024 10:21:20 AM   Labs 01/11/2024 cholesterol 162 LDL 74 A1c 6.4% hemoglobin 12.5 creatinine 1.34 potassium 4.9 TSH 1.7  Physical Exam:    VS:   BP 120/70   Pulse 72   Ht 5' 6 (1.676 m)   Wt 191 lb 9.6 oz (86.9 kg)   SpO2 94%   BMI 30.93 kg/m     Wt Readings from Last 3 Encounters:  01/19/24 191 lb 9.6 oz (86.9 kg)  10/10/22 195 lb 3.2 oz (88.5 kg)  04/01/22 203 lb (92.1 kg)     GEN: In her appearance she has had about a 60 pound weight loss well nourished, well developed in no acute distress HEENT: Normal NECK: No JVD; No carotid bruits LYMPHATICS: No lymphadenopathy CARDIAC: RRR, no murmurs, rubs, gallops RESPIRATORY:  Clear to auscultation without rales, wheezing or rhonchi  ABDOMEN: Soft, non-tender, non-distended MUSCULOSKELETAL:  No edema; No deformity  SKIN: Warm and dry NEUROLOGIC:  Alert and oriented x 3 PSYCHIATRIC:  Normal affect  Signed, Redell Leiter, MD  01/19/2024 10:32 AM    [facility]

## 2024-01-18 DIAGNOSIS — F32A Depression, unspecified: Secondary | ICD-10-CM | POA: Insufficient documentation

## 2024-01-18 DIAGNOSIS — Z860100 Personal history of colon polyps, unspecified: Secondary | ICD-10-CM | POA: Insufficient documentation

## 2024-01-18 DIAGNOSIS — I209 Angina pectoris, unspecified: Secondary | ICD-10-CM | POA: Insufficient documentation

## 2024-01-18 DIAGNOSIS — J45909 Unspecified asthma, uncomplicated: Secondary | ICD-10-CM | POA: Insufficient documentation

## 2024-01-19 ENCOUNTER — Encounter: Payer: Self-pay | Admitting: Cardiology

## 2024-01-19 ENCOUNTER — Ambulatory Visit: Attending: Cardiology | Admitting: Cardiology

## 2024-01-19 VITALS — BP 120/70 | HR 72 | Ht 66.0 in | Wt 191.6 lb

## 2024-01-19 DIAGNOSIS — E782 Mixed hyperlipidemia: Secondary | ICD-10-CM | POA: Diagnosis not present

## 2024-01-19 DIAGNOSIS — R001 Bradycardia, unspecified: Secondary | ICD-10-CM

## 2024-01-19 DIAGNOSIS — I119 Hypertensive heart disease without heart failure: Secondary | ICD-10-CM | POA: Diagnosis not present

## 2024-01-19 DIAGNOSIS — N1832 Chronic kidney disease, stage 3b: Secondary | ICD-10-CM

## 2024-01-19 DIAGNOSIS — I359 Nonrheumatic aortic valve disorder, unspecified: Secondary | ICD-10-CM | POA: Diagnosis not present

## 2024-01-19 DIAGNOSIS — I2510 Atherosclerotic heart disease of native coronary artery without angina pectoris: Secondary | ICD-10-CM | POA: Diagnosis not present

## 2024-01-19 NOTE — Patient Instructions (Signed)

## 2024-02-21 DIAGNOSIS — E559 Vitamin D deficiency, unspecified: Secondary | ICD-10-CM | POA: Diagnosis not present

## 2024-02-21 DIAGNOSIS — R918 Other nonspecific abnormal finding of lung field: Secondary | ICD-10-CM | POA: Diagnosis not present

## 2024-02-21 DIAGNOSIS — F17210 Nicotine dependence, cigarettes, uncomplicated: Secondary | ICD-10-CM | POA: Diagnosis not present

## 2024-02-21 DIAGNOSIS — J4541 Moderate persistent asthma with (acute) exacerbation: Secondary | ICD-10-CM | POA: Diagnosis not present

## 2024-02-21 DIAGNOSIS — G4733 Obstructive sleep apnea (adult) (pediatric): Secondary | ICD-10-CM | POA: Diagnosis not present

## 2024-03-18 ENCOUNTER — Encounter: Payer: Self-pay | Admitting: Gastroenterology

## 2024-04-16 DIAGNOSIS — R251 Tremor, unspecified: Secondary | ICD-10-CM | POA: Diagnosis not present

## 2024-04-16 DIAGNOSIS — Z23 Encounter for immunization: Secondary | ICD-10-CM | POA: Diagnosis not present

## 2024-04-16 DIAGNOSIS — E785 Hyperlipidemia, unspecified: Secondary | ICD-10-CM | POA: Diagnosis not present

## 2024-04-16 DIAGNOSIS — I2510 Atherosclerotic heart disease of native coronary artery without angina pectoris: Secondary | ICD-10-CM | POA: Diagnosis not present

## 2024-04-16 DIAGNOSIS — N184 Chronic kidney disease, stage 4 (severe): Secondary | ICD-10-CM | POA: Diagnosis not present

## 2024-04-16 DIAGNOSIS — F0393 Unspecified dementia, unspecified severity, with mood disturbance: Secondary | ICD-10-CM | POA: Diagnosis not present

## 2024-04-16 DIAGNOSIS — F3341 Major depressive disorder, recurrent, in partial remission: Secondary | ICD-10-CM | POA: Diagnosis not present

## 2024-04-16 DIAGNOSIS — E1169 Type 2 diabetes mellitus with other specified complication: Secondary | ICD-10-CM | POA: Diagnosis not present

## 2024-04-16 DIAGNOSIS — K219 Gastro-esophageal reflux disease without esophagitis: Secondary | ICD-10-CM | POA: Diagnosis not present

## 2024-04-16 DIAGNOSIS — I10 Essential (primary) hypertension: Secondary | ICD-10-CM | POA: Diagnosis not present

## 2024-04-23 ENCOUNTER — Ambulatory Visit (AMBULATORY_SURGERY_CENTER)

## 2024-04-23 VITALS — Ht 64.0 in | Wt 200.0 lb

## 2024-04-23 DIAGNOSIS — K219 Gastro-esophageal reflux disease without esophagitis: Secondary | ICD-10-CM

## 2024-04-23 DIAGNOSIS — K589 Irritable bowel syndrome without diarrhea: Secondary | ICD-10-CM

## 2024-04-23 DIAGNOSIS — Z860100 Personal history of colon polyps, unspecified: Secondary | ICD-10-CM

## 2024-04-23 DIAGNOSIS — R110 Nausea: Secondary | ICD-10-CM

## 2024-04-23 MED ORDER — PEG 3350-KCL-NA BICARB-NACL 420 G PO SOLR: 4000.0000 mL | Freq: Once | ORAL | 0 refills | Status: AC

## 2024-04-23 NOTE — Progress Notes (Signed)
 No issues known to pt with past sedation with any surgeries or procedures Patient denies ever being told they had issues or difficulty with intubation  No FH of Malignant Hyperthermia Pt is not on diet pills nor GLP-1 medications Pt is not on  home 02  Pt is not on blood thinners  Pt states issues with chronic constipation; takes Linzess which is effective, per patient  No A fib or A flutter Have any cardiac testing pending--no Pt instructed to use [employer] or GoodRx for a price reduction on prep  Ambulates independently

## 2024-05-01 ENCOUNTER — Encounter: Payer: Self-pay | Admitting: Gastroenterology

## 2024-05-07 ENCOUNTER — Encounter: Payer: Self-pay | Admitting: Gastroenterology

## 2024-05-07 ENCOUNTER — Ambulatory Visit: Admitting: Gastroenterology

## 2024-05-07 VITALS — BP 113/68 | HR 85 | Temp 97.2°F | Resp 20 | Ht <= 58 in | Wt 200.0 lb

## 2024-05-07 DIAGNOSIS — K5730 Diverticulosis of large intestine without perforation or abscess without bleeding: Secondary | ICD-10-CM | POA: Diagnosis not present

## 2024-05-07 DIAGNOSIS — F418 Other specified anxiety disorders: Secondary | ICD-10-CM | POA: Diagnosis not present

## 2024-05-07 DIAGNOSIS — D124 Benign neoplasm of descending colon: Secondary | ICD-10-CM

## 2024-05-07 DIAGNOSIS — G4733 Obstructive sleep apnea (adult) (pediatric): Secondary | ICD-10-CM | POA: Diagnosis not present

## 2024-05-07 DIAGNOSIS — Z1211 Encounter for screening for malignant neoplasm of colon: Secondary | ICD-10-CM

## 2024-05-07 DIAGNOSIS — N183 Chronic kidney disease, stage 3 unspecified: Secondary | ICD-10-CM | POA: Diagnosis not present

## 2024-05-07 DIAGNOSIS — I2510 Atherosclerotic heart disease of native coronary artery without angina pectoris: Secondary | ICD-10-CM | POA: Diagnosis not present

## 2024-05-07 DIAGNOSIS — R110 Nausea: Secondary | ICD-10-CM | POA: Diagnosis not present

## 2024-05-07 DIAGNOSIS — K219 Gastro-esophageal reflux disease without esophagitis: Secondary | ICD-10-CM | POA: Diagnosis not present

## 2024-05-07 DIAGNOSIS — K640 First degree hemorrhoids: Secondary | ICD-10-CM | POA: Diagnosis not present

## 2024-05-07 DIAGNOSIS — Z860100 Personal history of colon polyps, unspecified: Secondary | ICD-10-CM | POA: Diagnosis not present

## 2024-05-07 MED ORDER — SODIUM CHLORIDE 0.9 % IV SOLN: 500.0000 mL | Freq: Once | INTRAVENOUS | Status: DC

## 2024-05-07 NOTE — Patient Instructions (Addendum)
 Handouts provided on polyps, diverticulosis and hemorrhoids.  Resume previous diet.  Continue present medications.  Await pathology results.  Repeat colonoscopy for surveillance based on pathology results.   YOU HAD AN ENDOSCOPIC PROCEDURE TODAY AT THE [facility]:   Refer to the procedure report that was given to you for any specific questions about what was found during the examination.  If the procedure report does not answer your questions, please call your gastroenterologist to clarify.  If you requested that your care partner not be given the details of your procedure findings, then the procedure report has been included in a sealed envelope for you to review at your convenience later.  YOU SHOULD EXPECT: Some feelings of bloating in the abdomen. Passage of more gas than usual.  Walking can help get rid of the air that was put into your GI tract during the procedure and reduce the bloating. If you had a lower endoscopy (such as a colonoscopy or flexible sigmoidoscopy) you may notice spotting of blood in your stool or on the toilet paper. If you underwent a bowel prep for your procedure, you may not have a normal bowel movement for a few days.  Please Note:  You might notice some irritation and congestion in your nose or some drainage.  This is from the oxygen used during your procedure.  There is no need for concern and it should clear up in a day or so.  SYMPTOMS TO REPORT IMMEDIATELY:  Following lower endoscopy (colonoscopy or flexible sigmoidoscopy):  Excessive amounts of blood in the stool  Significant tenderness or worsening of abdominal pains  Swelling of the abdomen that is new, acute  Fever of 100F or higher  For urgent or emergent issues, a gastroenterologist can be reached at any hour by calling [phone]. Do not use MyChart messaging for urgent concerns.    DIET:  We do recommend a small meal at first, but then you may proceed to your regular diet.  Drink  plenty of fluids but you should avoid alcoholic beverages for 24 hours.  ACTIVITY:  You should plan to take it easy for the rest of today and you should NOT DRIVE or use heavy machinery until tomorrow (because of the sedation medicines used during the test).    FOLLOW UP: Our staff will call the number listed on your records the next business day following your procedure.  We will call around 7:15- 8:00 am to check on you and address any questions or concerns that you may have regarding the information given to you following your procedure. If we do not reach you, we will leave a message.     If any biopsies were taken you will be contacted by phone or by letter within the next 1-3 weeks.  Please call us at [phone] if you have not heard about the biopsies in 3 weeks.    SIGNATURES/CONFIDENTIALITY: You and/or your care partner have signed paperwork which will be entered into your electronic medical record.  These signatures attest to the fact that that the information above on your After Visit Summary has been reviewed and is understood.  Full responsibility of the confidentiality of this discharge information lies with you and/or your care-partner.

## 2024-05-07 NOTE — Op Note (Signed)
 [facility] Patient Name: Kara Olson Procedure Date: 05/07/2024 11:45 AM MRN: [id number] Endoscopist: Lynnie Bring , MD, [id number] Age: 68 Referring MD:  Date of Birth: 02/18/1956 Gender: Female Account #: [id number] Procedure:                Colonoscopy Indications:              High risk colon cancer surveillance: Personal                            history of colonic polyps Medicines:                Monitored Anesthesia Care Procedure:                Pre-Anesthesia Assessment:                           - Prior to the procedure, a History and Physical                            was performed, and patient medications and                            allergies were reviewed. The patient's tolerance of                            previous anesthesia was also reviewed. The risks                            and benefits of the procedure and the sedation                            options and risks were discussed with the patient.                            All questions were answered, and informed consent                            was obtained. Prior Anticoagulants: The patient has                            taken no anticoagulant or antiplatelet agents. ASA                            Grade Assessment: II - A patient with mild systemic                            disease. After reviewing the risks and benefits,                            the patient was deemed in satisfactory condition to                            undergo the procedure.  After obtaining informed consent, the colonoscope                            was passed under direct vision. Throughout the                            procedure, the patient's blood pressure, pulse, and                            oxygen saturations were monitored continuously. The                            Olympus Scope CF SN: [id number] was introduced through                            the anus and advanced to the 2  cm into the ileum.                            The colonoscopy was performed without difficulty.                            The patient tolerated the procedure well. The                            quality of the bowel preparation was good. The                            terminal ileum, ileocecal valve, appendiceal                            orifice, and rectum were photographed. Scope In: 11:57:24 AM Scope Out: 12:11:29 PM Scope Withdrawal Time: 0 hours 9 minutes 59 seconds  Total Procedure Duration: 0 hours 14 minutes 5 seconds  Findings:                 Two sessile polyps were found in the proximal                            descending colon and mid descending colon. The                            polyps were 2 mm in size. These polyps were removed                            with a cold biopsy forceps. Resection and retrieval                            were complete.                           A few rare small-mouthed diverticula were found in                            the sigmoid colon and  ascending colon.                           Non-bleeding internal hemorrhoids were found during                            retroflexion. The hemorrhoids were small and Grade                            I (internal hemorrhoids that do not prolapse).                           The terminal ileum appeared normal.                           Retroflexion in the right colon was performed.                           The exam was otherwise without abnormality on                            direct and retroflexion views. Complications:            No immediate complications. Estimated Blood Loss:     Estimated blood loss: none. Impression:               - Two 2 to 3 mm polyps in the proximal descending                            colon and in the mid descending colon, removed with                            a cold biopsy forceps. Resected and retrieved.                           - Minimal colonic diverticulosis                            - Non-bleeding internal hemorrhoids.                           - The examined portion of the ileum was normal.                           - The examination was otherwise normal on direct                            and retroflexion views. Recommendation:           - Patient has a contact number available for                            emergencies. The signs and symptoms of potential                            delayed complications were discussed with the  patient. Return to normal activities tomorrow.                            Written discharge instructions were provided to the                            patient.                           - Resume previous diet.                           - Continue present medications.                           - Await pathology results.                           - Repeat colonoscopy for surveillance based on                            pathology results.                           - The findings and recommendations were discussed                            with the patient's family. Lynnie Bring, MD 05/07/2024 12:16:41 PM This report has been signed electronically.

## 2024-05-07 NOTE — Progress Notes (Signed)
 Sedate, gd SR, tolerated procedure well, VSS, report to RN

## 2024-05-07 NOTE — Progress Notes (Signed)
 Called to room to assist during endoscopic procedure.  Patient ID and intended procedure confirmed with present staff. Received instructions for my participation in the procedure from the performing physician.

## 2024-05-07 NOTE — Progress Notes (Signed)
 [facility] History and Physical   Primary Care Physician:  Keren Vicenta BRAVO, MD   Reason for Procedure:   H/O polyps  Plan:    colon   The patient was provided an opportunity to ask questions and all were answered. The patient agreed with the plan.   HPI: Kara Olson is a 68 y.o. female    Past Medical History:  Diagnosis Date   Angina pectoris 03/29/2021   Anginal pain    Anxiety and depression    Arthropathy    Asthma    Atherosclerosis    Attention deficit hyperactivity disorder 03/29/2021   CAD (coronary artery disease)    Chronic kidney disease, stage 3 unspecified (HCC) 06/26/2023   Cigarette smoker 04/09/2014   Diabetes mellitus (HCC) 03/29/2021   Dyspnea 04/09/2014   Sept 29 2015 PFT's  fev1 1.68 (82%) ratio 83 and DLCO 80 and no change p saba    Elevated cholesterol    Essential hypertension 01/07/2016   Gallstone 03/29/2021   Gastroesophageal reflux disease 03/29/2021   History of colon polyps    Hyperlipidemia 01/07/2016   Irritable bowel syndrome 03/29/2021   Mild CAD 01/07/2016   Mixed anxiety and depressive disorder 03/29/2021   Obstructive sleep apnea syndrome 03/29/2021   on CPAP     OSA (obstructive sleep apnea)    on CPAP   Personal history of colon polyps, unspecified 03/29/2021   Pneumonia    Posterior rhinorrhea 04/09/2014   Followed in Pulmonary clinic/ [facility]/ [place]  - try off acei 04/09/2014      Followed in Pulmonary clinic/ [facility]/ [place] - try off acei 04/09/2014     Restless leg syndrome    Seizure (HCC) 03/29/2021   Sleep apnea 01/07/2016   Overview:  With pseudo heart failure   Syncope 10/28/2020   Upper airway cough syndrome 04/09/2014   [facility] [place] - try off acei 04/09/2014      Vitamin D deficiency 06/26/2023    Past Surgical History:  Procedure Laterality Date   ABDOMINAL HYSTERECTOMY     APPENDECTOMY  1980   CARPAL TUNNEL RELEASE      CERVICAL FUSION  2001   CHOLECYSTECTOMY  1997   COLONOSCOPY  01/17/2014   Colon polyp-status post polypectomy. Small internal hemorrhoids. Otherwise normal colonoscopy to the terminal ileum. The colon was somewhat redundant   ESOPHAGOGASTRODUODENOSCOPY  12/21/2012   Normal EGD   TOTAL ABDOMINAL HYSTERECTOMY W/ BILATERAL SALPINGOOPHORECTOMY     TRIGGER FINGER RELEASE     thumb   VESICOVAGINAL FISTULA CLOSURE W/ [facility]  1980    Prior to Admission medications   Medication Sig Start Date End Date Taking? Authorizing Provider  aspirin  EC 81 MG tablet Take 1 tablet (81 mg total) by mouth every other day. Swallow whole. Patient taking differently: Take 81 mg by mouth daily. Swallow whole. 04/01/22  Yes Monetta Redell PARAS, MD  atorvastatin (LIPITOR) 40 MG tablet Take 40 mg by mouth daily.   Yes [provider]  busPIRone (BUSPAR) 15 MG tablet Take 15 mg by mouth 2 (two) times daily. 12/12/23  Yes [provider]  ezetimibe (ZETIA) 10 MG tablet Take 10 mg by mouth daily.   Yes [provider]  furosemide (LASIX) 40 MG tablet Take 40 mg by mouth daily. 07/22/22  Yes [provider]  LINZESS 290 MCG CAPS capsule Take 290 mcg by mouth daily. 10/04/22  Yes [provider]  TRADJENTA 5 MG TABS  tablet Take 5 mg by mouth daily. 10/04/22  Yes [provider]  valsartan  (DIOVAN ) 320 MG tablet Take 320 mg by mouth daily. 08/03/22  Yes [provider]  venlafaxine XR (EFFEXOR-XR) 75 MG 24 hr capsule Take 75 mg by mouth daily.   Yes [provider]  albuterol  (VENTOLIN  HFA) 108 (90 Base) MCG/ACT inhaler Inhale 1 puff into the lungs every 6 (six) hours as needed for wheezing or shortness of breath.    [provider]  famotidine (PEPCID) 40 MG tablet Take 40 mg by mouth daily. Patient not taking: Reported on 05/07/2024 01/17/24   [provider]  ipratropium-albuterol  (DUONEB) 0.5-2.5 (3) MG/3ML SOLN Take 3 mLs by nebulization 4 (four)  times daily as needed (wjeezing or shortness of breath). Patient not taking: No sig reported    [provider]  nitroGLYCERIN  (NITROSTAT ) 0.4 MG SL tablet Place 1 tablet (0.4 mg total) under the tongue every 5 (five) minutes as needed for chest pain. 10/10/22   Monetta Redell PARAS, MD  Vitamin D, Ergocalciferol, (DRISDOL) 1.25 MG (50000 UNIT) CAPS capsule Take 50,000 Units by mouth every 7 (seven) days. Patient not taking: No sig reported    [provider]    Current Outpatient Medications  Medication Sig Dispense Refill   aspirin  EC 81 MG tablet Take 1 tablet (81 mg total) by mouth every other day. Swallow whole. (Patient taking differently: Take 81 mg by mouth daily. Swallow whole.) 90 tablet 3   atorvastatin (LIPITOR) 40 MG tablet Take 40 mg by mouth daily.     busPIRone (BUSPAR) 15 MG tablet Take 15 mg by mouth 2 (two) times daily.     ezetimibe (ZETIA) 10 MG tablet Take 10 mg by mouth daily.     furosemide (LASIX) 40 MG tablet Take 40 mg by mouth daily.     LINZESS 290 MCG CAPS capsule Take 290 mcg by mouth daily.     TRADJENTA 5 MG TABS tablet Take 5 mg by mouth daily.     valsartan  (DIOVAN ) 320 MG tablet Take 320 mg by mouth daily.     venlafaxine XR (EFFEXOR-XR) 75 MG 24 hr capsule Take 75 mg by mouth daily.     albuterol  (VENTOLIN  HFA) 108 (90 Base) MCG/ACT inhaler Inhale 1 puff into the lungs every 6 (six) hours as needed for wheezing or shortness of breath.     famotidine (PEPCID) 40 MG tablet Take 40 mg by mouth daily. (Patient not taking: Reported on 05/07/2024)     ipratropium-albuterol  (DUONEB) 0.5-2.5 (3) MG/3ML SOLN Take 3 mLs by nebulization 4 (four) times daily as needed (wjeezing or shortness of breath). (Patient not taking: No sig reported)     nitroGLYCERIN  (NITROSTAT ) 0.4 MG SL tablet Place 1 tablet (0.4 mg total) under the tongue every 5 (five) minutes as needed for chest pain. 25 tablet 1   Vitamin D, Ergocalciferol, (DRISDOL) 1.25 MG (50000 UNIT) CAPS  capsule Take 50,000 Units by mouth every 7 (seven) days. (Patient not taking: No sig reported)     No current facility-administered medications for this visit.    Allergies as of 05/07/2024 - Review Complete 05/07/2024  Allergen Reaction Noted   Tape Dermatitis 07/25/2018   Nsaids Other (See Comments) 03/22/2021   Sulfamethoxazole Hives 09/26/2017   Latex Rash 07/24/2018    Family History  Problem Relation Age of Onset   Heart disease Father    Heart disease Brother    Breast cancer Maternal Aunt  Colon cancer Neg Hx    Esophageal cancer Neg Hx    Colon polyps Neg Hx    Rectal cancer Neg Hx    Stomach cancer Neg Hx     Social History   Socioeconomic History   Marital status: Married    Spouse name: Not on file   Number of children: Not on file   Years of education: Not on file   Highest education level: Not on file  Occupational History   Occupation: Unemployed  Tobacco Use   Smoking status: Former    Current packs/day: 0.50    Average packs/day: 0.5 packs/day for 34.0 years (17.0 ttl pk-yrs)    Types: Cigarettes   Smokeless tobacco: Never  Vaping Use   Vaping status: Never Used  Substance and Sexual Activity   Alcohol use: No   Drug use: No   Sexual activity: Not on file  Other Topics Concern   Not on file  Social History Narrative   Not on file   Social Drivers of Health   Financial Resource Strain: Not on file  Food Insecurity: Low Risk  (09/24/2022)   Received from [facility]   Hunger Vital Sign    Within the past 12 months, you worried that your food would run out before you got money to buy more: Never true    Within the past 12 months, the food you bought just didn't last and you didn't have money to get more. : Never true  Transportation Needs: No Transportation Needs (09/24/2022)   Received from [facility]    In the past 12 months, has lack of reliable transportation kept you from medical appointments, meetings, work or  from getting things needed for daily living? : No  Physical Activity: Not on file  Stress: Not on file  Social Connections: Unknown (10/15/2021)   Received from [facility]   Social Network    Social Network: Not on file  Intimate Partner Violence: Unknown (09/17/2021)   Received from [facility]   HITS    Physically Hurt: Not on file    Insult or Talk Down To: Not on file    Threaten Physical Harm: Not on file    Scream or Curse: Not on file    Review of Systems: Positive for none All other review of systems negative except as mentioned in the HPI.  Physical Exam: Vital signs in last 24 hours: @VSRANGES @   General:   Alert,  Well-developed, well-nourished, pleasant and cooperative in NAD Lungs:  Clear throughout to auscultation.   Heart:  Regular rate and rhythm; no murmurs, clicks, rubs,  or gallops. Abdomen:  Soft, nontender and nondistended. Normal bowel sounds.   Neuro/Psych:  Alert and cooperative. Normal mood and affect. A and O x 3    No significant changes were identified.  The patient continues to be an appropriate candidate for the planned procedure and anesthesia.   Anselm Bring, MD. [facility] 05/07/2024 2:23 PM@

## 2024-05-08 ENCOUNTER — Telehealth: Payer: Self-pay

## 2024-05-08 NOTE — Telephone Encounter (Signed)
 Left message on answering machine.

## 2024-05-10 LAB — SURGICAL PATHOLOGY

## 2024-05-13 ENCOUNTER — Ambulatory Visit: Payer: Self-pay | Admitting: Gastroenterology
